# Patient Record
Sex: Female | Born: 1935
Health system: Southern US, Community
[De-identification: ages and names within clinical notes are randomized; demographics above are authoritative.]

## PROBLEM LIST (undated history)

## (undated) DIAGNOSIS — I4891 Unspecified atrial fibrillation: Secondary | ICD-10-CM

## (undated) DIAGNOSIS — M40209 Unspecified kyphosis, site unspecified: Secondary | ICD-10-CM

## (undated) DIAGNOSIS — R5383 Other fatigue: Secondary | ICD-10-CM

## (undated) DIAGNOSIS — I10 Essential (primary) hypertension: Secondary | ICD-10-CM

## (undated) DIAGNOSIS — C4491 Basal cell carcinoma of skin, unspecified: Secondary | ICD-10-CM

## (undated) HISTORY — DX: Other fatigue: R53.83

## (undated) HISTORY — DX: Unspecified atrial fibrillation: I48.91

## (undated) HISTORY — PX: BONE BIOPSY: SHX375

## (undated) HISTORY — PX: EYE SURGERY: SHX253

## (undated) HISTORY — DX: Unspecified kyphosis, site unspecified: M40.209

## (undated) HISTORY — PX: HIP FRACTURE SURGERY: SHX118

## (undated) HISTORY — DX: Basal cell carcinoma of skin, unspecified: C44.91

---

## 2000-03-13 ENCOUNTER — Encounter: Payer: Self-pay | Admitting: Neurosurgery

## 2000-03-14 ENCOUNTER — Encounter: Payer: Self-pay | Admitting: Neurosurgery

## 2000-03-14 ENCOUNTER — Inpatient Hospital Stay (HOSPITAL_COMMUNITY): Admission: RE | Admit: 2000-03-14 | Discharge: 2000-03-15 | Payer: Self-pay | Admitting: Neurosurgery

## 2001-01-25 ENCOUNTER — Other Ambulatory Visit: Admission: RE | Admit: 2001-01-25 | Discharge: 2001-01-25 | Payer: Self-pay | Admitting: Family Medicine

## 2006-01-25 ENCOUNTER — Encounter: Payer: Self-pay | Admitting: Interventional Radiology

## 2006-01-31 ENCOUNTER — Encounter (INDEPENDENT_AMBULATORY_CARE_PROVIDER_SITE_OTHER): Payer: Self-pay | Admitting: Specialist

## 2006-01-31 ENCOUNTER — Ambulatory Visit (HOSPITAL_COMMUNITY): Admission: RE | Admit: 2006-01-31 | Discharge: 2006-01-31 | Payer: Self-pay | Admitting: Interventional Radiology

## 2006-02-16 ENCOUNTER — Encounter: Payer: Self-pay | Admitting: Interventional Radiology

## 2006-04-26 ENCOUNTER — Encounter: Admission: RE | Admit: 2006-04-26 | Discharge: 2006-07-25 | Payer: Self-pay | Admitting: Family Medicine

## 2008-04-15 ENCOUNTER — Ambulatory Visit (HOSPITAL_COMMUNITY): Admission: RE | Admit: 2008-04-15 | Discharge: 2008-04-15 | Payer: Self-pay | Admitting: Family Medicine

## 2008-04-18 ENCOUNTER — Encounter (INDEPENDENT_AMBULATORY_CARE_PROVIDER_SITE_OTHER): Payer: Self-pay | Admitting: Interventional Radiology

## 2008-04-18 ENCOUNTER — Ambulatory Visit (HOSPITAL_COMMUNITY): Admission: RE | Admit: 2008-04-18 | Discharge: 2008-04-18 | Payer: Self-pay | Admitting: Interventional Radiology

## 2008-05-02 ENCOUNTER — Encounter: Payer: Self-pay | Admitting: Interventional Radiology

## 2008-05-28 ENCOUNTER — Inpatient Hospital Stay (HOSPITAL_COMMUNITY): Admission: EM | Admit: 2008-05-28 | Discharge: 2008-06-02 | Payer: Self-pay | Admitting: Emergency Medicine

## 2008-07-18 ENCOUNTER — Ambulatory Visit (HOSPITAL_COMMUNITY): Admission: RE | Admit: 2008-07-18 | Discharge: 2008-07-18 | Payer: Self-pay | Admitting: Interventional Radiology

## 2008-07-18 ENCOUNTER — Encounter: Payer: Self-pay | Admitting: Interventional Radiology

## 2008-07-23 ENCOUNTER — Ambulatory Visit (HOSPITAL_COMMUNITY): Admission: RE | Admit: 2008-07-23 | Discharge: 2008-07-23 | Payer: Self-pay | Admitting: Interventional Radiology

## 2008-07-23 ENCOUNTER — Encounter (INDEPENDENT_AMBULATORY_CARE_PROVIDER_SITE_OTHER): Payer: Self-pay | Admitting: Interventional Radiology

## 2009-04-07 ENCOUNTER — Ambulatory Visit (HOSPITAL_COMMUNITY): Admission: RE | Admit: 2009-04-07 | Discharge: 2009-04-07 | Payer: Self-pay | Admitting: Interventional Radiology

## 2009-04-13 ENCOUNTER — Encounter (INDEPENDENT_AMBULATORY_CARE_PROVIDER_SITE_OTHER): Payer: Self-pay | Admitting: Interventional Radiology

## 2009-04-13 ENCOUNTER — Ambulatory Visit (HOSPITAL_COMMUNITY): Admission: RE | Admit: 2009-04-13 | Discharge: 2009-04-13 | Payer: Self-pay | Admitting: Interventional Radiology

## 2009-08-02 ENCOUNTER — Ambulatory Visit: Payer: Self-pay | Admitting: Orthopedic Surgery

## 2009-08-02 ENCOUNTER — Inpatient Hospital Stay (HOSPITAL_COMMUNITY): Admission: EM | Admit: 2009-08-02 | Discharge: 2009-08-07 | Payer: Self-pay | Admitting: Emergency Medicine

## 2009-08-04 ENCOUNTER — Encounter: Payer: Self-pay | Admitting: Orthopedic Surgery

## 2009-08-07 ENCOUNTER — Encounter: Payer: Self-pay | Admitting: Orthopedic Surgery

## 2009-08-12 ENCOUNTER — Telehealth: Payer: Self-pay | Admitting: Orthopedic Surgery

## 2009-09-14 ENCOUNTER — Ambulatory Visit: Payer: Self-pay | Admitting: Orthopedic Surgery

## 2009-09-14 DIAGNOSIS — S72009A Fracture of unspecified part of neck of unspecified femur, initial encounter for closed fracture: Secondary | ICD-10-CM | POA: Insufficient documentation

## 2009-09-14 DIAGNOSIS — S42209A Unspecified fracture of upper end of unspecified humerus, initial encounter for closed fracture: Secondary | ICD-10-CM | POA: Insufficient documentation

## 2009-09-30 ENCOUNTER — Encounter: Payer: Self-pay | Admitting: Orthopedic Surgery

## 2009-10-05 ENCOUNTER — Encounter: Payer: Self-pay | Admitting: Orthopedic Surgery

## 2009-10-06 ENCOUNTER — Encounter (INDEPENDENT_AMBULATORY_CARE_PROVIDER_SITE_OTHER): Payer: Self-pay | Admitting: *Deleted

## 2009-10-06 ENCOUNTER — Encounter: Payer: Self-pay | Admitting: Orthopedic Surgery

## 2009-11-12 ENCOUNTER — Ambulatory Visit: Payer: Self-pay | Admitting: Orthopedic Surgery

## 2010-09-05 ENCOUNTER — Encounter: Payer: Self-pay | Admitting: Interventional Radiology

## 2010-09-05 ENCOUNTER — Encounter: Payer: Self-pay | Admitting: Family Medicine

## 2010-09-14 NOTE — Miscellaneous (Signed)
Summary: [facility] orders  [facility] orders   Imported By: Jacklynn Ganong 10/13/2009 13:07:19  _____________________________________________________________________  External Attachment:    Type:   Image     Comment:   External Document

## 2010-09-14 NOTE — Assessment & Plan Note (Signed)
Summary: 2 M RE-CK/XRAYS RT HIP/SURG 08/04/09/MEDICARE/CAF   Visit Type:  Follow-up  CC:  post op hip.  History of Present Illness: fractured RIGHT femoral neck, fractured RIGHT proximal humerus  Fracture proximal humerus is already healed and she did do some therapy for that.   DOS 08-04-09.  Procedure: OTIF of right hip with 3 cannulated screws.  week followup visit  Doing well, still has right shoulder pain, finished PT for hip and shoulder.  Tylenol and Vicodin for pain only as needed.  X-ray showed 3 cannulated screws in the RIGHT hip they're in good position fracture is healed impression healed RIGHT hip fracture with cannulated screws  The patient does have limited shoulder motion but is not a surgical candidate she is able to feed herself bathe self pressure teeth and do most household activities that she has been required to do is we will leave that alone for now she will continue home exercises  Followup p.r.n.      Other Orders: Post-Op Check (16109) Hip x-ray unilateral complete, minimum 2 views (60454)  Patient Instructions: 1)  Please schedule a follow-up appointment as needed.

## 2010-09-14 NOTE — Letter (Signed)
Summary: History form  History form   Imported By: Jacklynn Ganong 09/16/2009 08:55:56  _____________________________________________________________________  External Attachment:    Type:   Image     Comment:   External Document

## 2010-09-14 NOTE — Miscellaneous (Signed)
Summary: Nursing Home visit  [facility] Home visit   Imported By: Cammie Sickle 09/30/2009 09:55:17  _____________________________________________________________________  External Attachment:    Type:   Image     Comment:   External Document

## 2010-09-14 NOTE — Miscellaneous (Signed)
Summary: [facility] order  [facility] order   Imported By: Jacklynn Ganong 10/09/2009 08:08:21  _____________________________________________________________________  External Attachment:    Type:   Image     Comment:   External Document

## 2010-09-14 NOTE — Miscellaneous (Signed)
Summary: gave verbal order to extend OT 2 more weeks for hip and shoulder  Clinical Lists Changes  [facility]

## 2010-09-14 NOTE — Assessment & Plan Note (Signed)
Summary: 1 MO HOSP F/U RT FEM NECK FX/XR/SURGERY 08/04/09/MED/MEDICARE...   Visit Type:  Follow-up  CC:  post op right hip.  History of Present Illness: I saw [name] in the office today for a followup visit.  She is a 77 years old woman with the complaint of:  post op right hip.  DOS 08-04-09.  Procedure: OTIF of right hip with 3 cannulated screws.  Has non op right shoulder proximal humerus fracture   xrays 2 v right shoulder   Neer criteria met for prox humerus fracture   IMPR: non op prox hum fracture    2 views and pelvis right hip   3 cannulated screws with fracture position good   Assessment: Doing well; return in 2 mos fro xrays hip only     Impression & Recommendations:  Problem # 1:  AFTERCARE FOLLOW SURGERY MUSCULOSKEL SYSTEM NEC (ICD-V58.78) Assessment Comment Only  Orders: Post-Op Check (16109)  Problem # 2:  CLOSED FRACTURE UNSPECIFIED PART NECK FEMUR (ICD-820.8) Assessment: Comment Only  Orders: Post-Op Check (60454) Pelvis x-ray, 1/2 views (09811) Hip x-ray unilateral complete, minimum 2 views (91478)  Problem # 3:  FRACTURE, HUMERUS, PROXIMAL (ICD-812.00) Assessment: Comment Only  Orders: Post-Op Check (29562) Shoulder x-ray,  minimum 2 views (13086)  Patient Instructions: 1)  Please schedule a follow-up appointment in 2 months. 2)  xrays of the right hip [3 screws]

## 2010-09-27 ENCOUNTER — Other Ambulatory Visit (HOSPITAL_COMMUNITY): Payer: Self-pay | Admitting: Interventional Radiology

## 2010-09-27 DIAGNOSIS — M549 Dorsalgia, unspecified: Secondary | ICD-10-CM

## 2010-09-28 ENCOUNTER — Ambulatory Visit (HOSPITAL_COMMUNITY)
Admission: RE | Admit: 2010-09-28 | Discharge: 2010-09-28 | Disposition: A | Discharge status: Home / Self Care | Payer: MEDICARE | Source: Ambulatory Visit | Attending: Interventional Radiology | Admitting: Interventional Radiology

## 2010-09-28 DIAGNOSIS — M47817 Spondylosis without myelopathy or radiculopathy, lumbosacral region: Secondary | ICD-10-CM | POA: Insufficient documentation

## 2010-09-28 DIAGNOSIS — M549 Dorsalgia, unspecified: Secondary | ICD-10-CM | POA: Insufficient documentation

## 2010-09-28 DIAGNOSIS — Z981 Arthrodesis status: Secondary | ICD-10-CM | POA: Insufficient documentation

## 2010-09-28 DIAGNOSIS — M8448XA Pathological fracture, other site, initial encounter for fracture: Secondary | ICD-10-CM | POA: Insufficient documentation

## 2010-09-28 DIAGNOSIS — M519 Unspecified thoracic, thoracolumbar and lumbosacral intervertebral disc disorder: Secondary | ICD-10-CM | POA: Insufficient documentation

## 2010-09-28 DIAGNOSIS — M48061 Spinal stenosis, lumbar region without neurogenic claudication: Secondary | ICD-10-CM | POA: Insufficient documentation

## 2010-09-29 ENCOUNTER — Other Ambulatory Visit (HOSPITAL_COMMUNITY): Payer: Self-pay | Admitting: Interventional Radiology

## 2010-09-29 DIAGNOSIS — IMO0002 Reserved for concepts with insufficient information to code with codable children: Secondary | ICD-10-CM

## 2010-10-01 ENCOUNTER — Ambulatory Visit (HOSPITAL_COMMUNITY)
Admission: RE | Admit: 2010-10-01 | Discharge: 2010-10-01 | Disposition: A | Discharge status: Home / Self Care | Payer: MEDICARE | Source: Ambulatory Visit | Attending: Interventional Radiology | Admitting: Interventional Radiology

## 2010-10-01 ENCOUNTER — Other Ambulatory Visit: Payer: Self-pay | Admitting: Interventional Radiology

## 2010-10-01 DIAGNOSIS — I10 Essential (primary) hypertension: Secondary | ICD-10-CM | POA: Insufficient documentation

## 2010-10-01 DIAGNOSIS — E119 Type 2 diabetes mellitus without complications: Secondary | ICD-10-CM | POA: Insufficient documentation

## 2010-10-01 DIAGNOSIS — Z7901 Long term (current) use of anticoagulants: Secondary | ICD-10-CM | POA: Insufficient documentation

## 2010-10-01 DIAGNOSIS — IMO0002 Reserved for concepts with insufficient information to code with codable children: Secondary | ICD-10-CM

## 2010-10-01 DIAGNOSIS — I4891 Unspecified atrial fibrillation: Secondary | ICD-10-CM | POA: Insufficient documentation

## 2010-10-01 DIAGNOSIS — M8448XA Pathological fracture, other site, initial encounter for fracture: Secondary | ICD-10-CM | POA: Insufficient documentation

## 2010-10-01 LAB — CBC
Hemoglobin: 11.9 g/dL — ABNORMAL LOW (ref 12.0–15.0)
MCH: 29.5 pg (ref 26.0–34.0)
MCV: 85.4 fL (ref 78.0–100.0)
Platelets: 243 10*3/uL (ref 150–400)
RBC: 4.04 MIL/uL (ref 3.87–5.11)
RDW: 13.1 % (ref 11.5–15.5)

## 2010-10-01 LAB — POCT I-STAT, CHEM 8
BUN: 24 mg/dL — ABNORMAL HIGH (ref 6–23)
Calcium, Ion: 1.1 mmol/L — ABNORMAL LOW (ref 1.12–1.32)
Creatinine, Ser: 1 mg/dL (ref 0.4–1.2)
TCO2: 24 mmol/L (ref 0–100)

## 2010-10-01 LAB — PROTIME-INR: Prothrombin Time: 16.3 seconds — ABNORMAL HIGH (ref 11.6–15.2)

## 2010-10-06 ENCOUNTER — Other Ambulatory Visit (HOSPITAL_COMMUNITY): Payer: Self-pay | Admitting: Interventional Radiology

## 2010-10-06 ENCOUNTER — Ambulatory Visit (HOSPITAL_COMMUNITY)
Admission: RE | Admit: 2010-10-06 | Discharge: 2010-10-06 | Disposition: A | Discharge status: Home / Self Care | Payer: MEDICARE | Source: Ambulatory Visit | Attending: Interventional Radiology | Admitting: Interventional Radiology

## 2010-10-06 DIAGNOSIS — M545 Low back pain, unspecified: Secondary | ICD-10-CM | POA: Insufficient documentation

## 2010-10-06 DIAGNOSIS — M549 Dorsalgia, unspecified: Secondary | ICD-10-CM

## 2010-10-07 ENCOUNTER — Other Ambulatory Visit (HOSPITAL_COMMUNITY): Payer: Self-pay | Admitting: Interventional Radiology

## 2010-10-07 DIAGNOSIS — IMO0002 Reserved for concepts with insufficient information to code with codable children: Secondary | ICD-10-CM

## 2010-10-08 ENCOUNTER — Ambulatory Visit (HOSPITAL_COMMUNITY)
Admission: RE | Admit: 2010-10-08 | Discharge: 2010-10-08 | Disposition: A | Discharge status: Home / Self Care | Payer: Medicare Other | Source: Ambulatory Visit | Attending: Interventional Radiology | Admitting: Interventional Radiology

## 2010-10-08 ENCOUNTER — Other Ambulatory Visit (HOSPITAL_COMMUNITY): Payer: Self-pay | Admitting: Interventional Radiology

## 2010-10-08 DIAGNOSIS — IMO0002 Reserved for concepts with insufficient information to code with codable children: Secondary | ICD-10-CM

## 2010-10-08 DIAGNOSIS — M546 Pain in thoracic spine: Secondary | ICD-10-CM | POA: Insufficient documentation

## 2010-10-08 DIAGNOSIS — M8448XA Pathological fracture, other site, initial encounter for fracture: Secondary | ICD-10-CM | POA: Insufficient documentation

## 2010-10-08 LAB — CBC
HCT: 33.3 % — ABNORMAL LOW (ref 36.0–46.0)
Hemoglobin: 11.3 g/dL — ABNORMAL LOW (ref 12.0–15.0)
MCV: 85.2 fL (ref 78.0–100.0)
RBC: 3.91 MIL/uL (ref 3.87–5.11)
WBC: 8 10*3/uL (ref 4.0–10.5)

## 2010-10-08 LAB — POCT I-STAT, CHEM 8
BUN: 20 mg/dL (ref 6–23)
Chloride: 106 mEq/L (ref 96–112)
Creatinine, Ser: 1.1 mg/dL (ref 0.4–1.2)
Potassium: 4.2 mEq/L (ref 3.5–5.1)
Sodium: 140 mEq/L (ref 135–145)

## 2010-10-08 MED ORDER — IOHEXOL 300 MG/ML  SOLN: 3.0000 mL | Freq: Once | INTRAMUSCULAR | Status: AC | PRN

## 2010-10-08 MED ORDER — IOHEXOL 300 MG/ML  SOLN: 50.0000 mL | Freq: Once | INTRAMUSCULAR | Status: DC | PRN

## 2010-10-11 ENCOUNTER — Other Ambulatory Visit (HOSPITAL_COMMUNITY): Payer: Self-pay | Admitting: Interventional Radiology

## 2010-10-11 DIAGNOSIS — IMO0002 Reserved for concepts with insufficient information to code with codable children: Secondary | ICD-10-CM

## 2010-10-22 ENCOUNTER — Ambulatory Visit (HOSPITAL_COMMUNITY)
Admission: RE | Admit: 2010-10-22 | Discharge: 2010-10-22 | Disposition: A | Discharge status: Home / Self Care | Payer: Medicare Other | Source: Ambulatory Visit | Attending: Interventional Radiology | Admitting: Interventional Radiology

## 2010-10-22 ENCOUNTER — Ambulatory Visit (HOSPITAL_COMMUNITY): Admission: RE | Admit: 2010-10-22 | Payer: MEDICARE | Source: Ambulatory Visit

## 2010-10-22 ENCOUNTER — Other Ambulatory Visit (HOSPITAL_COMMUNITY): Payer: Self-pay | Admitting: Interventional Radiology

## 2010-10-22 DIAGNOSIS — M549 Dorsalgia, unspecified: Secondary | ICD-10-CM

## 2010-10-22 DIAGNOSIS — IMO0002 Reserved for concepts with insufficient information to code with codable children: Secondary | ICD-10-CM

## 2010-10-25 ENCOUNTER — Encounter (HOSPITAL_COMMUNITY): Payer: Medicare Other

## 2010-10-25 ENCOUNTER — Encounter (HOSPITAL_COMMUNITY): Payer: Self-pay

## 2010-10-25 ENCOUNTER — Ambulatory Visit (HOSPITAL_COMMUNITY)
Admission: RE | Admit: 2010-10-25 | Discharge: 2010-10-25 | Disposition: A | Discharge status: Home / Self Care | Payer: Medicare Other | Source: Ambulatory Visit | Attending: Diagnostic Radiology | Admitting: Diagnostic Radiology

## 2010-10-25 ENCOUNTER — Other Ambulatory Visit (HOSPITAL_COMMUNITY): Payer: Self-pay | Admitting: Diagnostic Radiology

## 2010-10-25 ENCOUNTER — Encounter (HOSPITAL_COMMUNITY)
Admission: RE | Admit: 2010-10-25 | Discharge: 2010-10-25 | Disposition: A | Discharge status: Home / Self Care | Payer: Medicare Other | Source: Ambulatory Visit | Attending: Interventional Radiology | Admitting: Interventional Radiology

## 2010-10-25 DIAGNOSIS — IMO0002 Reserved for concepts with insufficient information to code with codable children: Secondary | ICD-10-CM

## 2010-10-25 DIAGNOSIS — M549 Dorsalgia, unspecified: Secondary | ICD-10-CM | POA: Insufficient documentation

## 2010-10-25 DIAGNOSIS — M545 Low back pain, unspecified: Secondary | ICD-10-CM | POA: Insufficient documentation

## 2010-10-25 DIAGNOSIS — Z9889 Other specified postprocedural states: Secondary | ICD-10-CM | POA: Insufficient documentation

## 2010-10-25 HISTORY — DX: Essential (primary) hypertension: I10

## 2010-10-25 MED ORDER — TECHNETIUM TC 99M MEDRONATE IV KIT
25.0000 | PACK | Freq: Once | INTRAVENOUS | Status: AC | PRN
  Administered 2010-10-25: 25.3 via INTRAVENOUS

## 2010-10-27 ENCOUNTER — Inpatient Hospital Stay (HOSPITAL_COMMUNITY)
Admission: EM | Admit: 2010-10-27 | Discharge: 2010-10-28 | DRG: 535 | Disposition: A | Discharge status: Home Health | Payer: Medicare Other | Attending: Internal Medicine | Admitting: Internal Medicine

## 2010-10-27 ENCOUNTER — Emergency Department (HOSPITAL_COMMUNITY): Payer: Medicare Other

## 2010-10-27 DIAGNOSIS — E119 Type 2 diabetes mellitus without complications: Secondary | ICD-10-CM | POA: Diagnosis present

## 2010-10-27 DIAGNOSIS — R112 Nausea with vomiting, unspecified: Secondary | ICD-10-CM | POA: Diagnosis present

## 2010-10-27 DIAGNOSIS — S32509A Unspecified fracture of unspecified pubis, initial encounter for closed fracture: Principal | ICD-10-CM | POA: Diagnosis present

## 2010-10-27 DIAGNOSIS — I4891 Unspecified atrial fibrillation: Secondary | ICD-10-CM | POA: Diagnosis present

## 2010-10-27 DIAGNOSIS — J9600 Acute respiratory failure, unspecified whether with hypoxia or hypercapnia: Secondary | ICD-10-CM | POA: Diagnosis present

## 2010-10-27 DIAGNOSIS — I6200 Nontraumatic subdural hemorrhage, unspecified: Secondary | ICD-10-CM | POA: Diagnosis present

## 2010-10-27 DIAGNOSIS — M810 Age-related osteoporosis without current pathological fracture: Secondary | ICD-10-CM | POA: Diagnosis present

## 2010-10-27 DIAGNOSIS — W010XXA Fall on same level from slipping, tripping and stumbling without subsequent striking against object, initial encounter: Secondary | ICD-10-CM | POA: Diagnosis present

## 2010-10-27 DIAGNOSIS — T4275XA Adverse effect of unspecified antiepileptic and sedative-hypnotic drugs, initial encounter: Secondary | ICD-10-CM | POA: Diagnosis present

## 2010-10-27 DIAGNOSIS — I10 Essential (primary) hypertension: Secondary | ICD-10-CM | POA: Diagnosis present

## 2010-10-27 DIAGNOSIS — Y92009 Unspecified place in unspecified non-institutional (private) residence as the place of occurrence of the external cause: Secondary | ICD-10-CM

## 2010-10-27 DIAGNOSIS — Z9181 History of falling: Secondary | ICD-10-CM

## 2010-10-27 DIAGNOSIS — Z7901 Long term (current) use of anticoagulants: Secondary | ICD-10-CM

## 2010-10-27 DIAGNOSIS — R627 Adult failure to thrive: Secondary | ICD-10-CM | POA: Diagnosis present

## 2010-10-27 LAB — DIFFERENTIAL
Basophils Absolute: 0 K/uL (ref 0.0–0.1)
Basophils Relative: 0 % (ref 0–1)
Eosinophils Absolute: 0.2 K/uL (ref 0.0–0.7)
Eosinophils Relative: 1 % (ref 0–5)
Lymphocytes Relative: 7 % — ABNORMAL LOW (ref 12–46)
Lymphs Abs: 0.9 10*3/uL (ref 0.7–4.0)
Monocytes Absolute: 0.5 K/uL (ref 0.1–1.0)
Monocytes Relative: 3 % (ref 3–12)
Neutro Abs: 11.6 K/uL — ABNORMAL HIGH (ref 1.7–7.7)
Neutrophils Relative %: 88 % — ABNORMAL HIGH (ref 43–77)

## 2010-10-27 LAB — BLOOD GAS, ARTERIAL
Acid-base deficit: 0.1 mmol/L (ref 0.0–2.0)
Bicarbonate: 25 mEq/L — ABNORMAL HIGH (ref 20.0–24.0)
Drawn by: 103701
FIO2: 0.21 %
O2 Saturation: 80.2 %
Patient temperature: 98.6
TCO2: 23 mmol/L (ref 0–100)
pCO2 arterial: 45.1 mmHg — ABNORMAL HIGH (ref 35.0–45.0)
pH, Arterial: 7.363 (ref 7.350–7.400)
pO2, Arterial: 47 mmHg — ABNORMAL LOW (ref 80.0–100.0)

## 2010-10-27 LAB — CK TOTAL AND CKMB (NOT AT ARMC)
CK, MB: 1.4 ng/mL (ref 0.3–4.0)
Relative Index: INVALID (ref 0.0–2.5)
Total CK: 30 U/L (ref 7–177)

## 2010-10-27 LAB — CBC
HCT: 36.7 % (ref 36.0–46.0)
Hemoglobin: 12.2 g/dL (ref 12.0–15.0)
MCH: 28.8 pg (ref 26.0–34.0)
MCHC: 33.2 g/dL (ref 30.0–36.0)
MCV: 86.8 fL (ref 78.0–100.0)
Platelets: 186 K/uL (ref 150–400)
RBC: 4.23 MIL/uL (ref 3.87–5.11)
RDW: 13.2 % (ref 11.5–15.5)
WBC: 13.1 10*3/uL — ABNORMAL HIGH (ref 4.0–10.5)

## 2010-10-27 LAB — BASIC METABOLIC PANEL
CO2: 26 mEq/L (ref 19–32)
Chloride: 107 mEq/L (ref 96–112)
GFR calc non Af Amer: 57 mL/min — ABNORMAL LOW (ref >60)
Glucose, Bld: 207 mg/dL — ABNORMAL HIGH (ref 70–99)
Potassium: 3.8 mEq/L (ref 3.5–5.1)
Sodium: 140 mEq/L (ref 135–145)

## 2010-10-27 LAB — GLUCOSE, CAPILLARY: Glucose-Capillary: 190 mg/dL — ABNORMAL HIGH (ref 70–99)

## 2010-10-27 LAB — BASIC METABOLIC PANEL WITH GFR
BUN: 17 mg/dL (ref 6–23)
Calcium: 9.4 mg/dL (ref 8.4–10.5)
Creatinine, Ser: 0.92 mg/dL (ref 0.4–1.2)
GFR calc Af Amer: >60 mL/min (ref >60)

## 2010-10-27 LAB — TROPONIN I: Troponin I: 0.02 ng/mL (ref 0.00–0.06)

## 2010-10-27 LAB — PROTIME-INR
INR: 3.64 — ABNORMAL HIGH (ref 0.00–1.49)
Prothrombin Time: 36.2 s — ABNORMAL HIGH (ref 11.6–15.2)

## 2010-10-28 LAB — URINE MICROSCOPIC-ADD ON

## 2010-10-28 LAB — URINALYSIS, ROUTINE W REFLEX MICROSCOPIC
Leukocytes, UA: NEGATIVE
Nitrite: NEGATIVE
Specific Gravity, Urine: 1.015 (ref 1.005–1.030)
Urobilinogen, UA: 1 mg/dL (ref 0.0–1.0)
pH: 5.5 (ref 5.0–8.0)

## 2010-10-29 LAB — URINE CULTURE

## 2010-10-29 NOTE — H&P (Signed)
[name], TIPPETTS NO.:  [id number]  MEDICAL RECORD NO.:  [id number]           PATIENT TYPE:  E  LOCATION:  WLED                         FACILITY:  [facility]  PHYSICIAN:  Vania Rea, M.D. DATE OF BIRTH:  [birth year]  DATE OF ADMISSION:  10/27/2010 DATE OF DISCHARGE:                             HISTORY & PHYSICAL   PRIMARY CARE PHYSICIAN:  Rudi Heap, M.D.  ORTHOPEDIC SURGEONS:  Dr. Dorene Grebe in [place] and Dr. Fuller Canada in [place].  CHIEF COMPLAINT:  Recurrent falls.  HISTORY OF PRESENT ILLNESS:  This is a 77 year old Caucasian lady with a history of osteoporosis and multiple vertebral fractures status post multiple kyphoplasties, last kyphoplasty 2 weeks ago, who also has a history of falls occasioning fractures of both hips over the year.  She is status post bilateral hip repair.  She also has a history of status post fall with a right humeral fracture which was not amenable tosurgery.  In any event, because of the patient's ongoing gait instability, her children have been living at home with her in order to assist her, but she got up to go to the bathroom at 12:30 a.m. last night and sustained a fall.  She did not hit her head, was found unconscious on the floor complaining of pain in her left ribs, and was brought into the emergency room this morning for further evaluation. During the course of the evaluation, the patient was found to have a superior pubic ramus fracture and the Hospitalist Service was called to assist with management.  The patient denies any chest pain, shortness of breath, or palpitations.  She denies any passage of bloody or black stool.  There is no history of fever.  The patient is on chronic Coumadin anticoagulation for atrial fibrillation despite the fact that she apparently does not want to be on Coumadin, and her family does not want her to be on Coumadin and they are worried because of her frequent falls  whilst on Coumadin.  In the ambulance en route, the patient received 2 mg of Dilaudid and subsequently started having episodes of nausea and vomiting.  In the emergency room, the patient has received intravenous fentanyl for pain as well as Zofran and Phenergan for nausea.  Consequently, the patient is somewhat groggy and is also somewhat hypoxic, presumably due to respiratory suppression from narcotics.  However, her family is at bedside and able to give a clear history.  PAST MEDICAL HISTORY: 1. Hypertension. 2. Diabetes. 3. Chronic atrial fibrillation, on long-term anticoagulation. 4. Multiple vertebral compression fractures status post multiple     episodes of kyphoplasty. 5. Osteoporosis, intolerant of calcium. 6. Bilateral hip fractures status post bilateral hip repairs. 7. Remote right humerus fracture status post reduction. 8. History of depression.  MEDICATIONS: 1. Diltiazem 240 mg daily. 2. Metformin 500 mg daily. 3. Sertraline 100 mg daily. 4. Coumadin per protocol.  ALLERGIES:  CODEINE.  SOCIAL HISTORY:  No history of tobacco, alcohol, or illicit drug use. She is a retired Tree surgeon.  She spends her days just basically ambulating around her home.  She uses a walker for ambulation.  Her code status is do not resuscitate.  FAMILY HISTORY:  Significant for cancers, coronary artery disease, and diabetes.  She has four living siblings; one brother, four sisters. Ages range 37-95.  There is no family history of strokes.  REVIEW OF SYSTEMS:  Other than noted above, unremarkable.  PHYSICAL EXAM:  GENERAL:  Frail, elderly Caucasian lady reclining in the stretcher.  Somewhat sedated. VITALS SIGNS:  Her temperature is 97.9, her pulse is 90, respirations 20, blood pressure 147/72.  She is saturating at 88% on room air. HEENT:  Her pupils are round and equal.  Mucous membranes are pale. Anicteric.  No cervical lymphadenopathy.  No thyromegaly.  No carotid bruit. CHEST:   Clear to auscultation bilaterally. CARDIOVASCULAR SYSTEM:  Irregularly irregular rhythm. ABDOMEN:  Scaphoid and soft.  She is tender in the left anterior flank over the lower ribs.  There are no masses, no bruising of the skin. EXTREMITIES:  Wasted.  There is no edema.  She has arthritic deformities of the hands, knees, ankles.  Feet are cool.  The capillary refill is brisk.  Dorsalis pedis pulses are 1+ bilaterally. CENTRAL NERVOUS SYSTEM:  Within the limits of the exam.  Cranial nerves 2-12 are grossly intact.  She has no focal lateralizing signs.  LABS:  Arterial blood gas on room air shows a pH of 7.36, pCO2 45, and pO2 47, saturating at 80%.  Her cardiac enzymes are unremarkable with a total CK of 30, CK-MB 1.4, and troponin 0.02.  Her white count is 13.1, hemoglobin 12.2, platelets 186.  She has 88% neutrophils.  Absolute neutrophil count is 11.16.  Her sodium is 140, potassium 3.8, chloride 107, CO2 26, glucose 207, BUN 17, creatinine 0.9, calcium 9.4.  Her INR is supratherapeutic at 3.64.  X-RAYS:  A CT of her head shows a low density subdural hygroma on the left which may be chronic, 4-mm midline shift towards the right.  No acute hemorrhage, atrophy, and chronic microvascular ischemia.  CT of the pelvis without contrast shows nondisplaced left superior pubic symphysis fracture, callus formation with probable remote trauma of the left superior pubic ramus, and nondisplaced acute fracture may be present as well.  Postoperative changes of the hips bilaterally. Heterotopic bone about the left greater trochanter.  Rib views show no definite acute findings, but there do appear to be several old left rib fractures.  X-rays of the spine show no new fracture or progressive collapse.  She does have a subacute T12 treated with kyphoplasty.  X- rays of the hips show postoperative changes.  EKG shows atrial fibrillation with nonspecific ST-wave changes.  Rate is 89.  ASSESSMENT: 1.  Right superior pubic ramus fracture. 2. Recurrent falls. 3. Adult failure to thrive. 4. Chronic atrial fibrillation, on long-term Coumadin anticoagulation. 5. Diabetes type 2, uncontrolled. 6. Hypertension. 7. History of depression. 8. Acute respiratory failure, likely secondary to narcotic therapy. 9. Nausea and vomiting, likely secondary to narcotic therapy.  PLAN: 1. Will admit this lady for physical therapy and pain control.  She     will likely be discharged back home for home physical therapy and     24-hour care as indicated by her family. 2. Because of her recurrent falls, would recommend discontinuing     Coumadin and starting her on baby aspirin.  She is a high fall risk     and she does have past evidence of old subdural hematomas. 3. Will check urinalysis to rule out urinary tract infection  contributing to her instability. 4. Will continue her metformin, but will add sliding scale insulin to     attempt to improve control. 5. Will continue her other home medications. 6. Other plans as per orders.     Vania Rea, M.D.     LC/MEDQ  D:  10/27/2010  T:  10/27/2010  Job:  [id number]  cc:   Ernestina Penna, M.D. Fax: [phone]  Vickki Hearing, M.D. Fax: [phone]  Dr. Corliss Skains Interventional Radiology  Dr. Lance Bosch, [place]  Electronically Signed by Vania Rea M.D. on 10/29/2010 02:18:32 AM

## 2010-11-01 LAB — GLUCOSE, CAPILLARY: Glucose-Capillary: 156 mg/dL — ABNORMAL HIGH (ref 70–99)

## 2010-11-09 NOTE — Discharge Summary (Signed)
[name], [name]               ACCOUNT NO.:  [id number]  MEDICAL RECORD NO.:  [id number]           PATIENT TYPE:  I  LOCATION:  1506                         FACILITY:  [facility]  PHYSICIAN:  Margaret Blinks, MD     DATE OF BIRTH:  12-28-1917  DATE OF ADMISSION:  10/27/2010 DATE OF DISCHARGE:                              DISCHARGE SUMMARY   PRIMARY CARE PHYSICIAN:  [name], M.D.  ORTHOPEDIST:  [name], M.D.  DISCHARGE DIAGNOSES: 1. Left superior pubic symphysis fracture status post fall. 2. Left superior pubic ramus fracture status post fall. 3. History of multiple falls. 4. Atrial fibrillation, previously on Coumadin, now discontinued     secondary to multiple falls. 5. Type 2 diabetes. 6. History of depression. 7. Hypertension. 8. Acute respiratory failure likely secondary to narcotic therapy,     resolved. 9. Nausea and vomiting, resolved.  DISCHARGE MEDICATIONS: 1. Aspirin 81 mg p.o. daily. 2. Zoloft 100 mg p.o. daily. 3. Metformin XR 500 mg p.o. daily. 4. Cardizem CD 240 mg p.o. daily. 5. Hydrocodone/APAP 5/500 mg 1 tablet p.o. daily p.r.n.  MEDICATIONS STOPPED IN THE HOSPITAL: 1. Coumadin. 2. Ambien.  ADMISSION HISTORY:  This is a 77 year old Caucasian lady with a history of osteoporosis, multiple vertebral fractures, status post multiple kyphoplasties, who has had multiple falls and in fact has had bilateral hip repair in the past.  She presents to the hospital after having a fall at 12:30 a.m.  The patient was complaining of pain in her left ribs and brought to the emergency room.  During her evaluation, she was noted to have a superior pubic ramus fracture, and the hospitalists were asked to assist in management.  For further details, please refer to the history and physical dictated by Dr. Orvan [name] on October 27, 2010.  HOSPITAL COURSE: 1. Pelvic fractures.  Case was discussed with Dr. Ophelia [name] from [facility] and it was recommended  that the patient be     weightbearing as tolerated.  Continue with physical therapy as well     as pain management.  He recommended to follow up with Dr. August [name] in     the next 1 week.  The patient is being seen by Physical Therapy at     this time and is cleared for discharge home.  We will set her up     with home health physical therapy, otherwise, we will discuss     possible skilled nursing facility options with her. 2. Chronic atrial fibrillation.  The patient is currently rate     controlled.  She is clearly not a candidate for Coumadin with her     multiple falls and high risk for bleeding. 3. Abnormal CT head.  The patient was found to have a low-density     subdural hygroma on the left which appeared to be chronic, 2-4 mm     midline shift towards the right with no acute hemorrhage.  The     patient does not have any neurologic deficits at this time.  She is     likely not a candidate  for any neurosurgical intervention.  We will     have the patient follow up with the primary care physician and     possible referral to outpatient neurosurgery if felt appropriate or     if the patient does develop any neurologic symptoms.  CONSULTATIONS:  Telephone consultation with Dr. Ophelia [name] from [facility] with results as above.  PROCEDURES:  None.  DIAGNOSTIC IMAGING: 1. X-ray of the left hip on October 27, 2010, shows postoperative     changes and osteopenia, no acute findings. 2. X-ray of lumbar spine on October 27, 2010, shows no acute fracture,     progressive collapse identified compared to prior exams.  The     patient has known subacute fracture at T12, treated with     kyphoplasty. 3. X-ray of the ribs on October 27, 2010, shows no definite acute     findings.  There appeared to be several old left rib fractures. 4. CT of the pelvis on October 27, 2010, shows nondisplaced left     superior pubic symphysis fracture, callus formation, with probable     remote trauma of the left  superior pubic ramus, nondisplaced acute     fracture may be present as well.  Postoperative changes with     bilaterally heterotopic bone about the left greater trochanter. 5. CT head on October 27, 2010, shows low density subdural hygroma on     the left which may be chronic, 4 mm midline shift towards the     right, no acute hemorrhage, atrophy, or microvascular ischemia. 6. Chest x-ray from October 27, 2010, shows no acute abnormalities and     osteopenia.  DISCHARGE INSTRUCTIONS:  The patient should continue on heart-healthy, low-calorie diet.  Conduct her activity as tolerated.  If she is cleared by Physical Therapy, she will be set up with home health physical therapy if she will be continuing to stay with her family at home.  She tells me that she does have someone with her 24 hours.  CONDITION AT TIME OF DISCHARGE:  Improved.  Plan was discussed with the patient as well as her family who are in agreement.  TIME SPENT:  50 minutes.     Margaret Blinks, MD     JM/MEDQ  D:  10/28/2010  T:  10/28/2010  Job:  [id number]  cc:   [name], M.D. Fax: [phone]  G. Dorene Grebe, M.D. Fax: [phone]  Electronically Signed by [name]  on 11/09/2010 06:15:41 PM

## 2010-11-15 LAB — GLUCOSE, CAPILLARY
Glucose-Capillary: 150 mg/dL — ABNORMAL HIGH (ref 70–99)
Glucose-Capillary: 151 mg/dL — ABNORMAL HIGH (ref 70–99)
Glucose-Capillary: 154 mg/dL — ABNORMAL HIGH (ref 70–99)
Glucose-Capillary: 166 mg/dL — ABNORMAL HIGH (ref 70–99)
Glucose-Capillary: 174 mg/dL — ABNORMAL HIGH (ref 70–99)
Glucose-Capillary: 181 mg/dL — ABNORMAL HIGH (ref 70–99)
Glucose-Capillary: 238 mg/dL — ABNORMAL HIGH (ref 70–99)
Glucose-Capillary: 95 mg/dL (ref 70–99)

## 2010-11-15 LAB — CBC
HCT: 25.7 % — ABNORMAL LOW (ref 36.0–46.0)
HCT: 30.1 % — ABNORMAL LOW (ref 36.0–46.0)
Hemoglobin: 12 g/dL (ref 12.0–15.0)
Hemoglobin: 8.8 g/dL — ABNORMAL LOW (ref 12.0–15.0)
MCHC: 33 g/dL (ref 30.0–36.0)
MCHC: 33.2 g/dL (ref 30.0–36.0)
MCHC: 33.5 g/dL (ref 30.0–36.0)
MCHC: 34.1 g/dL (ref 30.0–36.0)
MCHC: 34.2 g/dL (ref 30.0–36.0)
MCHC: 34.2 g/dL (ref 30.0–36.0)
MCV: 87.3 fL (ref 78.0–100.0)
MCV: 87.7 fL (ref 78.0–100.0)
MCV: 88.2 fL (ref 78.0–100.0)
MCV: 88.5 fL (ref 78.0–100.0)
MCV: 88.7 fL (ref 78.0–100.0)
Platelets: 175 10*3/uL (ref 150–400)
RBC: 3.41 MIL/uL — ABNORMAL LOW (ref 3.87–5.11)
RBC: 3.73 MIL/uL — ABNORMAL LOW (ref 3.87–5.11)
RBC: 4.03 MIL/uL (ref 3.87–5.11)
RDW: 13.1 % (ref 11.5–15.5)
RDW: 13.2 % (ref 11.5–15.5)
RDW: 13.2 % (ref 11.5–15.5)
RDW: 13.3 % (ref 11.5–15.5)
WBC: 11 10*3/uL — ABNORMAL HIGH (ref 4.0–10.5)
WBC: 8.6 10*3/uL (ref 4.0–10.5)

## 2010-11-15 LAB — COMPREHENSIVE METABOLIC PANEL
ALT: 11 U/L (ref 0–35)
ALT: 20 U/L (ref 0–35)
AST: 21 U/L (ref 0–37)
AST: 41 U/L — ABNORMAL HIGH (ref 0–37)
Albumin: 2.5 g/dL — ABNORMAL LOW (ref 3.5–5.2)
Alkaline Phosphatase: 79 U/L (ref 39–117)
CO2: 24 mEq/L (ref 19–32)
CO2: 25 mEq/L (ref 19–32)
Calcium: 8.7 mg/dL (ref 8.4–10.5)
Calcium: 8.9 mg/dL (ref 8.4–10.5)
Chloride: 101 mEq/L (ref 96–112)
GFR calc Af Amer: >60 mL/min (ref >60)
GFR calc Af Amer: >60 mL/min (ref >60)
GFR calc non Af Amer: 57 mL/min — ABNORMAL LOW (ref >60)
GFR calc non Af Amer: >60 mL/min (ref >60)
Glucose, Bld: 139 mg/dL — ABNORMAL HIGH (ref 70–99)
Potassium: 4.1 mEq/L (ref 3.5–5.1)
Sodium: 133 mEq/L — ABNORMAL LOW (ref 135–145)
Sodium: 138 mEq/L (ref 135–145)
Total Bilirubin: 0.5 mg/dL (ref 0.3–1.2)
Total Protein: 5.5 g/dL — ABNORMAL LOW (ref 6.0–8.3)

## 2010-11-15 LAB — CROSSMATCH
ABO/RH(D): O POS
Antibody Screen: NEGATIVE

## 2010-11-15 LAB — DIFFERENTIAL
Basophils Absolute: 0 10*3/uL (ref 0.0–0.1)
Basophils Absolute: 0 10*3/uL (ref 0.0–0.1)
Basophils Relative: 0 % (ref 0–1)
Basophils Relative: 0 % (ref 0–1)
Basophils Relative: 0 % (ref 0–1)
Eosinophils Absolute: 0 10*3/uL (ref 0.0–0.7)
Eosinophils Absolute: 0 10*3/uL (ref 0.0–0.7)
Eosinophils Absolute: 0 10*3/uL (ref 0.0–0.7)
Eosinophils Absolute: 0 10*3/uL (ref 0.0–0.7)
Eosinophils Absolute: 0.1 10*3/uL (ref 0.0–0.7)
Eosinophils Relative: 0 % (ref 0–5)
Eosinophils Relative: 0 % (ref 0–5)
Eosinophils Relative: 0 % (ref 0–5)
Lymphocytes Relative: 10 % — ABNORMAL LOW (ref 12–46)
Lymphs Abs: 0.7 10*3/uL (ref 0.7–4.0)
Lymphs Abs: 1 10*3/uL (ref 0.7–4.0)
Monocytes Absolute: 0.4 10*3/uL (ref 0.1–1.0)
Monocytes Absolute: 0.5 10*3/uL (ref 0.1–1.0)
Monocytes Absolute: 0.5 10*3/uL (ref 0.1–1.0)
Monocytes Absolute: 0.5 10*3/uL (ref 0.1–1.0)
Monocytes Relative: 5 % (ref 3–12)
Monocytes Relative: 6 % (ref 3–12)
Neutro Abs: 6.1 10*3/uL (ref 1.7–7.7)
Neutrophils Relative %: 82 % — ABNORMAL HIGH (ref 43–77)
Neutrophils Relative %: 86 % — ABNORMAL HIGH (ref 43–77)

## 2010-11-15 LAB — ABO/RH: ABO/RH(D): O POS

## 2010-11-15 LAB — HEMOGLOBIN AND HEMATOCRIT, BLOOD: Hemoglobin: 10.8 g/dL — ABNORMAL LOW (ref 12.0–15.0)

## 2010-11-15 LAB — POCT I-STAT 4, (NA,K, GLUC, HGB,HCT)
HCT: 25 % — ABNORMAL LOW (ref 36.0–46.0)
Hemoglobin: 8.5 g/dL — ABNORMAL LOW (ref 12.0–15.0)
Potassium: 4.3 mEq/L (ref 3.5–5.1)
Sodium: 139 mEq/L (ref 135–145)

## 2010-11-15 LAB — PROTIME-INR
INR: 2.44 — ABNORMAL HIGH (ref 0.00–1.49)
Prothrombin Time: 15.9 seconds — ABNORMAL HIGH (ref 11.6–15.2)

## 2010-11-15 LAB — HEMOGLOBIN A1C: Hgb A1c MFr Bld: 6.1 % (ref 4.6–6.1)

## 2010-11-15 LAB — BASIC METABOLIC PANEL
BUN: 12 mg/dL (ref 6–23)
CO2: 23 mEq/L (ref 19–32)
CO2: 25 mEq/L (ref 19–32)
CO2: 27 mEq/L (ref 19–32)
Calcium: 8.6 mg/dL (ref 8.4–10.5)
Calcium: 9.9 mg/dL (ref 8.4–10.5)
Chloride: 104 mEq/L (ref 96–112)
Chloride: 104 mEq/L (ref 96–112)
Chloride: 111 mEq/L (ref 96–112)
Creatinine, Ser: 0.8 mg/dL (ref 0.4–1.2)
Creatinine, Ser: 0.84 mg/dL (ref 0.4–1.2)
Creatinine, Ser: 1.18 mg/dL (ref 0.4–1.2)
GFR calc Af Amer: 52 mL/min — ABNORMAL LOW (ref >60)
Glucose, Bld: 143 mg/dL — ABNORMAL HIGH (ref 70–99)
Glucose, Bld: 154 mg/dL — ABNORMAL HIGH (ref 70–99)

## 2010-11-15 LAB — LIPID PANEL
LDL Cholesterol: 68 mg/dL (ref 0–99)
VLDL: 17 mg/dL (ref 0–40)

## 2010-11-15 LAB — PHOSPHORUS: Phosphorus: 3.1 mg/dL (ref 2.3–4.6)

## 2010-11-15 LAB — TSH: TSH: 1.743 u[IU]/mL (ref 0.350–4.500)

## 2010-11-15 LAB — BRAIN NATRIURETIC PEPTIDE: Pro B Natriuretic peptide (BNP): 175 pg/mL — ABNORMAL HIGH (ref 0.0–100.0)

## 2010-11-15 LAB — APTT: aPTT: 37 seconds (ref 24–37)

## 2010-11-20 LAB — BASIC METABOLIC PANEL
BUN: 18 mg/dL (ref 6–23)
CO2: 28 mEq/L (ref 19–32)
GFR calc non Af Amer: 60 mL/min — ABNORMAL LOW (ref >60)
Glucose, Bld: 97 mg/dL (ref 70–99)
Potassium: 4.6 mEq/L (ref 3.5–5.1)

## 2010-11-20 LAB — CBC
HCT: 32.8 % — ABNORMAL LOW (ref 36.0–46.0)
Hemoglobin: 11.2 g/dL — ABNORMAL LOW (ref 12.0–15.0)
MCHC: 34 g/dL (ref 30.0–36.0)
Platelets: 320 10*3/uL (ref 150–400)
RDW: 13.3 % (ref 11.5–15.5)

## 2010-11-20 LAB — APTT: aPTT: 28 seconds (ref 24–37)

## 2010-12-28 NOTE — Op Note (Signed)
[name], [name]               ACCOUNT NO.:  [id number]   MEDICAL RECORD NO.:  [id number]          PATIENT TYPE:  INP   LOCATION:  5031                         FACILITY:  [facility]   PHYSICIAN:  Burnard Bunting, M.D.    DATE OF BIRTH:  06/10/1918   DATE OF PROCEDURE:  05/29/2008  DATE OF DISCHARGE:                               OPERATIVE REPORT   PREOPERATIVE DIAGNOSIS:  Left hip fracture, 4-part; proximal femur  fracture, intertrochanteric, subtrochanteric.   POSTOPERATIVE DIAGNOSIS:  Left hip fracture, 4-part; proximal femur  fracture, intertrochanteric, subtrochanteric.   PROCEDURE:  Left hip open reduction and internal fixation with  intramedullary hip screw.   SURGEON:  Burnard Bunting, MD   ASSISTANT:  None.   ANESTHESIA:  General endotracheal.   ESTIMATED BLOOD LOSS:  75 mL.   INDICATIONS:  [name] is an ambulatory 77 year old female with  left hip pain after a fall.  She presents now for operative management  of her fracture.   PROCEDURE IN DETAIL:  The patient was brought to the operating room  where general endotracheal anesthesia was induced.  Preoperative  antibiotics were administered.  Time-out was called.  The patient was  placed in a fracture table with the right leg in lithotomy position with  peroneal nerve well padded and the left leg under traction with slight  internal rotation.  The fracture was reduced under fluoroscopic  guidance, in AP and lateral planes.  The area was prescrubbed with  Chlorhexidine and Betadine, which was allowed to air dry and then  prepped with DuraPrep solution.  The operative field was then covered  with Ioban.   Topographic anatomy was identified including the greater trochanter.  The incision was made proximal to the greater trochanter.  Guide pin was  placed through the medial aspect of the greater trochanteric tip and  then advanced across the fracture site.  Proximal canal was reamed,  nailed, which was templated  preoperatively was placed, 10 x 36 cm.  Good  reduction of the fracture was achieved.  The proximal lag screw was then  placed in the center-center portion of the head with tip apex distance  of about approximately 5 mm.  At this time, the 2 proximal incisions  were irrigated.  One distal interlocking screw was placed in the dynamic  hole.  Good fracture reduction was achieved.  All  incisions were irrigated and then closed using interrupted inverted 0  Vicryl suture, 2-0 Vicryl, and skin staples.  Impervious dressing was  placed.  The patient tolerated the procedure well without immediate  complication.  She was transferred to the recovery in stable condition.      Burnard Bunting, M.D.  Electronically Signed     GSD/MEDQ  D:  05/29/2008  T:  05/30/2008  Job:  [id number]

## 2010-12-28 NOTE — Consult Note (Signed)
[name], MIONE               ACCOUNT NO.:  [id number]   MEDICAL RECORD NO.:  [id number]          PATIENT TYPE:  OUT   LOCATION:  MRI                          FACILITY:  [facility]   PHYSICIAN:  Sanjeev K. Deveshwar, M.D.DATE OF BIRTH:  [birth year]   DATE OF CONSULTATION:  07/18/2008  DATE OF DISCHARGE:                                 CONSULTATION   CHIEF COMPLAINT:  Back pain.   HISTORY OF PRESENT ILLNESS:  This is a very pleasant 77 year old female  known to Dr. Corliss Skains from previous compression fractures and  kyphoplasties.  The patient had a T7 kyphoplasty performed on April 18, 2008.  She had a T9 kyphoplasty performed in June 2007.  She had  excellent results with stabilization of the fractures and relief of pain  with both procedures.  Biopsies from both procedures were negative for  malignancies.   The patient fell and fractured her hip in October.  On May 29, 2008,  she had a left hip open reduction and internal fixation performed by Dr.  Dorene Grebe.  Following that she was admitted to [facility]  to undergo physical therapy and reconditioning.  Last Saturday while at  the nursing home, she was changing her clothes.  When she bent over to  take her pants off, she developed sudden onset of low back pain.  She  presents today accompanied by her sister and brother-in-law to be  evaluated.   PAST MEDICAL HISTORY:  Significant for  1. The above-noted kyphoplasty and the left hip open reduction and      internal fixation performed on May 29, 2008.  2. The patient has a history of chronic atrial fibrillation and is on      chronic Coumadin therapy.  Dr. Antoine Poche is her cardiologist.  3. She has diabetes mellitus.  4. Hypertension.  5. Hyperlipidemia.   SURGICAL HISTORY:  The patient had an L4-L5 laminectomy performed in the  past by Dr. Trey Sailors.  She had a left hip ORIF on May 29, 2008  performed by Dr. Dorene Grebe.  The patient denies any  previous problems  with anesthesia.   ALLERGIES:  CODEINE causes nausea.   CURRENT MEDICATIONS:  Vicodin p.r.n. for pain.  The patient believes she  is taking 2-3 tablets per day and she is also on Zocor, Coumadin,  Cardizem, Diovan, Zoloft and an oral medication for diabetes control.   SOCIAL HISTORY:  The patient is widowed.  She has one son and a daughter-  in-law who are very supportive.  The patient was living alone in  [place], [place].  She is now at [facility] with  plans to return home to independent living once she has recovered from  her hip surgery.  She has never used alcohol or tobacco.   FAMILY HISTORY:  Mother died at age 12 from natural causes, her father  died at age 2 from natural causes.   IMPRESSION AND PLAN:  As noted, the patient presents today for further  evaluation of back pain which occurred after bending over to change her  clothes.  She states the pain is 10 on a 1 to 10 scale despite Vicodin  for pain control.  She is comfortable while sedentary but experiences  severe pain with any type of movement.  She localizes the pain across  the low back at approximately her waistline.  Dr. Corliss Skains was  concerned that the patient may have a new fracture.  He recommended an  MRI to be performed today without contrast to rule out another fracture.  As noted, the patient is on Coumadin.  If she does have a new fracture,  we will plan on performing a kyphoplasty or vertebroplasty next Tuesday  or Wednesday.  We will need to stop her Coumadin today or tomorrow for  the procedure.  Further recommendations will be made once we have the  final report on the MRI.   Greater than 30 minutes was spent on this consult.      Delton See, P.A.    ______________________________  Grandville Silos. Corliss Skains, M.D.    DR/MEDQ  D:  07/18/2008  T:  07/19/2008  Job:  [id number]   cc:   Ernestina Penna, M.D.

## 2010-12-28 NOTE — Consult Note (Signed)
NAMEADYSSON, REVELLE               ACCOUNT NO.:  [id number]   MEDICAL RECORD NO.:  [id number]          PATIENT TYPE:  OUT   LOCATION:  MRI                          FACILITY:  [facility]   PHYSICIAN:  Sanjeev K. Deveshwar, M.D.DATE OF BIRTH:  27-Dec-1917   DATE OF CONSULTATION:  DATE OF DISCHARGE:                                 CONSULTATION   CHIEF COMPLAINT:  T9 compression fracture.   HISTORY OF PRESENT ILLNESS:  This is a very pleasant, alert, and active  77 year old female who was referred to Dr. Corliss Skains through the  courtesy of Dr. Rudi Heap for evaluation of a possible new  compression fracture.  The patient has a history of a previous T9  compression fracture that was treated with kyphoplasty in June 2007 by  Dr. Corliss Skains.  The patient had excellent results with almost immediate  relief of pain.   Last week, the patient awoke with back pain in the thoracic area once  again.  She denies any specific injury.  She states the pain is an 8 on  a 1-10 scale.  The patient does live alone.  She usually does her own  cooking and self-care, although her son and daughter-in-law have been  doing her household chores.  Since the fracture, the patient has severe  pain when she is up and around.  She spends most of her time being  sedentary either sitting or lying, which does give her some relief.  She  has been taking Tylenol for the pain.  She also takes a half of a  hydrocodone once a day for the pain.  The patient presents today  accompanied by her son and daughter-in-law for further evaluation of her  back pain.  She did have an MRI just prior to this visit.   PAST MEDICAL HISTORY:  Significant for:  1. Diabetes mellitus.  2. Hypertension.  3. Hyperlipidemia.  4. Chronic atrial fibrillation with chronic Coumadin therapy.  Dr.      Antoine Poche is her cardiologist, although she has not seen him for      sometime.   SURGICAL HISTORY:  The patient had a previous L4-L5 laminectomy  performed by Dr. Trey Sailors.  She denies any previous problems with  anesthesia.   ALLERGIES:  CODEINE causes nausea.   SOCIAL HISTORY:  The patient is widowed.  She has 1 son and a daughter-  in-law who are very supportive.  [place].  She was very independent prior to her compression  fracture.  She has never used alcohol or tobacco.   FAMILY HISTORY:  Her mother died at age 67 from natural causes.  Her  father died at age 36 from natural causes.   CURRENT MEDICATIONS:  1. Hydrocodone one-half tablet daily p.r.n.  2. Coumadin daily.  3. Zocor daily.  4. Glucophage daily.  5. Cardizem CD daily.  6. Iron supplement.  7. Diovan.  8. Zoloft.  9. Valium p.r.n.  10.Vitamin D on a daily basis.   IMPRESSION AND PLAN:  As noted, the patient presents today for further  evaluation  of back pain having undergone a T9 kyphoplasty in June 2007.  The pathology report at that time was negative for malignancy.  The  patient awoke last Tuesday with further back pain between her shoulder  blades.  She had had a chest x-ray in May per the patient's report that  had questioned a compression fracture.  We performed an MRI today, which  was reviewed by Dr. Corliss Skains.  He showed the images to the patient and  her family.  She does have a new T7 compression fracture that appears to  be fairly recent.  Her T9 fracture is stable having undergone a  kyphoplasty 2 years ago.   Dr. Corliss Skains discussed treatment options such as continued pain  medication with sedentary lifestyle versus kyphoplasty or  vertebroplasty.  The patient is anxious to proceed with the intervention  for relief of her pain and stabilization of the fracture.  She has not  taken her Coumadin today.  We would need to hold the Coumadin at least 5  days prior to the intervention.  We told the patient to stay off  Coumadin but to start aspirin 325 mg daily while she is off the  Coumadin.  We  hope to perform her vertebroplasty or kyphoplasty on  Friday of this week.  The patient's daughter-in-law will take her to the  [facility] in [place] on Thursday to have her Protime checked.  They will call the results to [place].  At that time, we will try to make a  decision whether or not to proceed on Friday.  We will also recheck her  protime on Friday morning.   Because of the level of the fracture being T7, which is a fairly small  vertebrae, Dr. Corliss Skains was not certain if kyphoplasty could be  performed, a vertebroplasty might need to be performed instead.  The  patient and family are aware of this.   All of their questions were answered.  Greater than 30 minutes was spent  on this consult.      Delton See, P.A.    ______________________________  Grandville Silos. Corliss Skains, M.D.    DR/MEDQ  D:  04/15/2008  T:  04/16/2008  Job:  [id number]   cc:   Ernestina Penna, M.D.

## 2010-12-28 NOTE — H&P (Signed)
NAMEANJELI, CASAD               ACCOUNT NO.:  [id number]   MEDICAL RECORD NO.:  [id number]          PATIENT TYPE:  INP   LOCATION:  5031                         FACILITY:  [facility]   PHYSICIAN:  Burnard Bunting, M.D.    DATE OF BIRTH:  1918-01-20   DATE OF ADMISSION:  05/28/2008  DATE OF DISCHARGE:                              HISTORY & PHYSICAL   CHIEF COMPLAINT:  Left hip pain.   HISTORY OF PRESENT ILLNESS:  [name] is an 77 year old female, who  lives alone at home, who is ambulatory with assistive device, who fell  while planting mums on May 28, 2008.  She denied any loss of  consciousness.  She had immediate onset of left hip pain which was  moderate in severity.  She denies any numbness and tingling or any other  orthopedic complaints.  She was evaluated by EMS, brought to the  emergency room for further workup.   MEDICATIONS:  Coumadin, Cardizem, Diovan, Glucophage, Tylenol, Zocor,  and Zoloft.   PAST MEDICAL HISTORY:  1. Hypertension.  2. Diabetes.  3. Depression.  4. Atrial fibrillation.  5. Hyperlipidemia.   ALLERGIES:  She is allergic to CODEINE.   SOCIAL HISTORY:  She is a nonsmoker, nondrinker.  No drug abuse.  She  does have a son, who lives here in [place].   PAST SURGICAL HISTORY:  Vertebroplasty x2 earlier this year.   REVIEW OF SYSTEMS:  A 14-organ systems reviewed and are negative.   PHYSICAL EXAMINATION:  GENERAL:  She is a well-developed, well-  nourished.  She is in mild distress.  She is alert and oriented.  The  patient is thin.  VITAL SIGNS:  Blood pressure is 153/78, pulse 99, respirations 18, and  pulse oximetry 96% on room air.  NECK:  Full range of motion.  CHEST:  Clear to auscultation.  CARDIAC:  Heart beats with irregularly irregular heart rate and less  than 90.  ABDOMEN:  Her abdominal exam is benign.  EXTREMITIES:  She has palpable radial pulses.  Good range of motion of  wrists, shoulders, and elbows bilaterally.  She is  holding on her right  leg.  She has bilateral pedal pulses distally.  She has good dorsi and  plantar flexion strength distally with no definite paresthesias on the  dorsoplantar aspect of the feet or hands.  She has no pain with range of  motion of right ankle, knee, or hip.   LABORATORY VALUES:  Sodium 140, potassium 4.4, chloride 104, carbon  dioxide 27, glucose 168, BUN 25, creatinine 0.93.  White count 15.9,  hemoglobin 10.4, platelets 306.  INR is 3.5.  Chest x-ray, no infiltrate  or congestive heart failure.  EKG shows atrial fibrillation, rate  controlled.  AP and lateral pelvis shows subtrochanteric-  intertrochanteric fracture of the left hip with fairly wide femoral  canal.   IMPRESSION:  Left hip intertrochanteric-subtrochanteric fracture in a  patient with significant medical comorbidities including diabetes,  hypertension, and atrial fibrillation.  The patient is ambulatory and  thus will need operative fixation.  Risks and benefits of that have  been  discussed today, including but not limited to infection, nonunion,  malunion, nerve and vessel damage as well as prolonged recovery and a  possible period of nonweightbearing depending on fracture stability.  All questions were answered.  We will proceed with vitamin K initiation  today, and we will recheck her INR and once it is less than or equal to  1.5, we will proceed with surgery.  All questions were answered.      Burnard Bunting, M.D.  Electronically Signed     GSD/MEDQ  D:  05/29/2008  T:  05/29/2008  Job:  [id number]

## 2010-12-28 NOTE — Discharge Summary (Signed)
[name], [name]               ACCOUNT NO.:  [id number]   MEDICAL RECORD NO.:  [id number]          PATIENT TYPE:  INP   LOCATION:  5031                         FACILITY:  [facility]   PHYSICIAN:  Burnard Bunting, M.D.    DATE OF BIRTH:  1918/05/22   DATE OF ADMISSION:  05/28/2008  DATE OF DISCHARGE:  06/02/2008                               DISCHARGE SUMMARY   DISCHARGE DIAGNOSIS:  Left hip fracture.   SECONDARY DIAGNOSES:  1. Hypertension.  2. Diabetes.  3. Depression.  4. Atrial fibrillation.  5. Hyperlipidemia.   OPERATIONS AND PROCEDURES:  Left hip open reduction and internal  fixation, May 29, 2008.   HOSPITAL COURSE:  [name] is an 77 year old ambulatory patient  who fell on the day of admission and broke her left hip.  She was given  vitamin K in order to decrease her INR to less than 1.5 and when that  was achieved, she underwent operative fixation of her fracture.  She  tolerated that procedure well without immediate complications.  The  patient was transfused 2 units packed red bleed cells for hematocrit,  which dropped from 30 to 22.  On postop day #1, she bounced back up to  30 after 2 units of transfusion.  She was mobilized with physical  therapy and started back on Coumadin for DVT prophylaxis.  Her INR was  1.5 at the time of discharge.  Her incision was intact.  She will  maintain a nonweightbearing status on the left lower extremity until a  repeat clinical appointment in 2 weeks.  If radiographs appear stable,  then we will initiate touch-down weightbearing at that time.   DISCHARGE MEDICATIONS:  1. Cardizem 240 mg p.o. daily.  2. Diovan 80 mg p.o. daily.  3. Zocor 1 p.o. daily.  4. Glucophage 500 mg p.o. t.i.d.  5. Coumadin 5 mg p.o. daily.   She will return to clinic in about a week for suture removal.  She was  discharged to a nursing home in good condition.      Burnard Bunting, M.D.  Electronically Signed     GSD/MEDQ  D:   06/02/2008  T:  06/02/2008  Job:  [id number]

## 2010-12-31 NOTE — Discharge Summary (Signed)
[place]. [facility]  Patient:    [name], [name]                      MRN: [id number] Adm. Date:  [id number] Disc. Date: [id number] Attending:  Emeterio Reeve                           Discharge Summary  ADMISSION DIAGNOSIS:  Herniated disc L4-5 on the left.  DISCHARGE DIAGNOSIS:  Herniated disc L4-5 on the left.  OPERATIVE PROCEDURE:  An L4-5 laminectomy discectomy.  SURGEON:  Payton Doughty, M.D.  SERVICE:  Neurosurgery.  COMPLICATIONS:  None.  DISCHARGE STATUS:  Alive and well.  HISTORY OF PRESENT ILLNESS:  A 77 year old right-handed white female whose history and physical is recounted in the chart.  She has had three or four weeks of increasing left lower extremity pain.  MRI showing a large herniated disc at L4-5.  She was admitted for discectomy.  PAST MEDICAL HISTORY:  Benign.  PHYSICAL EXAMINATION:  General physical examination is unremarkable. Neurologic examination shows left L5 radiculopathy.  She was admitted after ascertaining normal laboratory values and underwent an L4-5 laminectomy discectomy.  Postoperatively she has done well. Strength is full.  Her incision is dry.  She has some nausea immediately postoperatively but that has resolved.  She is now up eating and voiding normally.  DISPOSITION:  She is being discharged to home in the care of her family.  DISCHARGE MEDICATIONS:  Darvocet for pain.  FOLLOW-UP:  She will follow up with me in the [facility] office in about a month. DD:  03/15/00 TD:  03/16/00 Job: [id number]

## 2010-12-31 NOTE — Consult Note (Signed)
[name], [name]               ACCOUNT NO.:  [id number]   MEDICAL RECORD NO.:  [id number]          PATIENT TYPE:  OUT   LOCATION:  XRAY                         FACILITY:  [facility]   PHYSICIAN:  Sanjeev K. Deveshwar, M.D.DATE OF BIRTH:  June 23, 1918   DATE OF CONSULTATION:  02/16/2006  DATE OF DISCHARGE:                                   CONSULTATION   DATE OF CONSULT:  February 16, 2006   CHIEF COMPLAINT:  Compression fracture status post T9 kyphoplasty.   HISTORY OF PRESENT ILLNESS:  This is a very pleasant 77 year old female who  developed back pain in late May or early June.  She denied any history of  falling or any other trauma.  An MRI was performed June 6 that showed an  acute T9 compression fracture with a 50% loss of height.  The patient was  referred to Dr. Corliss Skains through the courtesy of Dr. Rudi Heap for  further evaluation and treatment.  On January 31, 2006 the patient had a T9  kyphoplasty performed by Dr. Corliss Skains which she tolerated well.  The  patient returns today accompanied by family members to be seen in follow-up  approximately two weeks after her procedure.   PAST HISTORY:  1.  Diabetes mellitus.  2.  Hyperlipidemia.  3.  Hypertension.  4.  Chronic atrial fibrillation.  5.  She is on Coumadin therapy.  The Coumadin was held for her intervention.  6.  She had a previous L4-L5 laminectomy performed by Dr. Trey Sailors.   ALLERGIES:  CODEINE causes nausea.   SOCIAL HISTORY:  The patient is widowed.  She has one son.  She lives alone  in [place], [place].  She was very independent prior to her  compression fracture.  She has never used alcohol or tobacco.   FAMILY HISTORY:  The patient's mother died at age 23 from natural causes.  Her father died at age 73 from natural causes.   IMPRESSION AND PLAN:  As noted, the patient is seen in follow-up today after  undergoing a T9 kyphoplasty on January 31, 2006.  Her biopsy results were  negative for any  malignancy.  She is now back on her Coumadin.  She reports  that a recent INR was 2.4.   The patient is essentially pain-free, although she describes some tightness  across her lower back at times.  She has been walking for exercise and is  able to walk at least 20 minutes a day.  She is not taking any pain  medication right now except for a hydrocodone at bedtime which she takes  mainly for sleep.  Dr. Corliss Skains suggests that she talk to her primary care  physician to see if he could prescribe something different for sleep rather  than hydrocodone.  She states that she does not tolerate Benadryl, although  she could take some Tylenol.   Overall, the patient is doing very well.  Her appetite is better.  She  recently gained 5 pounds.  She denies any shortness of breath.  She sleeps  well at night when she takes a Vicodin.  She believes she had a bone density  study at some point in the past, but does not remember when this was  performed and this may need to be repeated, although she does report that  she has been intolerant to Fosamax in the past.  She is anxious to start  driving again.  Dr. Corliss Skains did give her permission to drive, although he  wants her to continue to limit her activity and to avoid household chores  such as doing laundry or vacuuming which puts a significant strain on her  back.  She was told that she is at risk for further compression fractures in  the future.  The patient was told to contact us or her primary care  physician if she develops back pain similar to what she experienced with her  previous fracture.   Greater than 15 minutes was spent on this consult.      Delton See, P.A.    ______________________________  Grandville Silos. Corliss Skains, M.D.    DR/MEDQ  D:  02/16/2006  T:  02/16/2006  Job:  [id number]   cc:   Ernestina Penna, M.D.  Fax: [phone]

## 2010-12-31 NOTE — Op Note (Signed)
[place]. [facility]  Patient:    [name], [name]                      MRN: [id number] Proc. Date: 03/14/00 Adm. Date:  [id number] Disc. Date: [id number] Attending:  Emeterio Reeve                           Operative Report  PREOPERATIVE DIAGNOSIS:  Herniated disc at L4-5 left.  POSTOPERATIVE DIAGNOSIS:  Herniated disc at L4-5 left.  OPERATION:  L4-5 laminectomy and diskectomy at L4-5 on the left side.  SURGEON:  Dr. Channing Mutters.  ANESTHESIA:  General endotracheal.  PREP:  Scrubbed with alcohol wipe.  COMPLICATIONS:  None.  HISTORY OF PRESENT ILLNESS:  This patient is a 77 year old right-handed white female with a left L5 radiculopathy and a herniated disc at L4-5.  DESCRIPTION OF PROCEDURE:  She is brought to the operating room, intubated, placed prone on the operating table.  Following shave, prepped and draped in the usual sterile fashion, the skin was infiltrated with 1% lidocaine with 1:100,000 epinephrine, and the skin was incised from the top of L4 to the bottom of L5, and the lamina of L4-5 were exposed on the left side and subperiosteal plane.  Intraoperative x-ray confirmed correctness of level. Hemi semilaminectomy of L4 was carried out up to the top of the ligamentum flavum which was removed in a retrograde fashion.  The lateral aspect of the L5 nerve root was demonstrated, and was dissected free and retracted medially. This exposed a large herniated disc with significant compression of the left L5 root.  The disc was free.  It was grasped and removed without difficulty. The disc space itself was then explored after division of a few remaining annular fibers.  The removal of the large fragment resulted in immediate decompression of the left L5 root, and the disc space was carefully explored, and all graftable fragments were removed.  The L5 root was then explored in all quadrants and found to be free.  The wound was irrigated,  hemostasis assured.  The laminectomy defect covered with a Medrol soaked fat.  The fascia was reapproximated with 0 Vicryl in interrupted fashion.  Subcutaneous tissues were reapproximated with 0 Vicryl in interrupted fashion.  Subcuticular tissues were reapproximated with 3-0 Vicryl in interrupted fashion.  The skin was closed with 4-0 Vicryl in running subcuticular fashion.  Benzoin and Steri-Strips were placed made occlusive with Telfa and Op-Site.  The patient was then returned to the recovery room in good condition. DD:  03/14/00 TD:  03/15/00 Job: [id number]

## 2010-12-31 NOTE — H&P (Signed)
[place]. [facility]  Patient:    [name], [name]                        MRN: [id number] Adm. Date:  03/14/00 Attending:  Payton Doughty, M.D.                         History and Physical  ADMITTING DIAGNOSIS:  Herniated disc, L4-5 on the left.  This 77 year old right-handed white lady, for the past three to four weeks, had  increasing back pain and pain down her left leg posteriorly to the knee. ____________ precipitating event other than a fall several weeks before the pain started.  She does not have anything in her right leg, and her bladder is not affected.  MEDICAL HISTORY/MEDICATIONS:  Remarkable for hypertension, for which she is on Norvasc 2.5 mg q.d., Diovan 80 mg q.d.  She is an adult onset diabetic, for which she takes Amaryl 1 mg q.d. and Zocor 10 mg q.d.  ALLERGIES:  She gets sick in her stomach with CODEINE.  PAST SURGICAL HISTORY:  She has not had any operations.  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  FAMILY HISTORY:  Not given.  REVIEW OF SYSTEMS:  Remarkable for glasses, hypertension, hypercholesterolemia,  back pain, leg pain.  She had a skin cancer in the past.  Diabetes, as noted above.  PHYSICAL EXAMINATION:  HEENT:  Exam within normal limits.  NECK:  She had good range of motion of her neck.  CHEST:  Clear.  CARDIAC:  Regular rate and rhythm, with a 1/6 systolic ejection murmur.  ABDOMEN:  Nontender; no hepatosplenomegaly.  EXTREMITIES:  Without clubbing or cyanosis.  Peripheral pulses are good.  GU:  Exam deferred.    NEUROLOGIC:  She is awake, alert and oriented.  Cranial nerves are intact. Motor exam has 5/5 strength throughout the upper and lower extremities save for mild dorsiflexion weakness in the left foot.  There is no current sensory deficit. Reflexes are ______ at the knee, absent at the ankles.  Straight leg raise is negative.  She  cannot stand to walk because of back and leg pain.  MRI shows a herniated disc which is largely eccentric to the left at L4-5 with compression of the L5-S1 ridge.  CLINICAL IMPRESSION:  Left L5 radiculopathy secondary to L4-5 disc.  PLAN:  Lumbar laminectomy/discectomy.  The risks and benefits of this approach have been discussed with her and she wishes to proceed. DD:  03/14/00 TD:  03/14/00 Job: [id number]

## 2010-12-31 NOTE — Consult Note (Signed)
[name], [name]               ACCOUNT NO.:  [id number]   MEDICAL RECORD NO.:  [id number]          PATIENT TYPE:  OUT   LOCATION:  XRAY                         FACILITY:  [facility]   PHYSICIAN:  Sanjeev K. Deveshwar, M.D.DATE OF BIRTH:  04-25-1918   DATE OF CONSULTATION:  01/25/2006  DATE OF DISCHARGE:                                   CONSULTATION   CHIEF COMPLAINT:  Compression fracture.   HISTORY OF PRESENT ILLNESS:  This is a very pleasant 77 year old female who  woke up with back pain approximately three weeks ago.  She denies any recent  falls.  She had an MRI performed on January 18, 2006, that showed an acute T9  compression fracture with a 50% loss of height.  She is currently taking  Vicodin was some relief, however, she continues to have debilitating pain.  She has been referred to Dr. Corliss Skains through the courtesy of Dr. Christell Constant for  further evaluation and treatment.   PAST MEDICAL HISTORY:  1.  Diabetes mellitus.  2.  Hyperlipidemia.  3.  Hypertension.  4.  She has a history of atrial fibrillation and is on chronic Coumadin      therapy.  She has seen Dr. Rollene Rotunda for this problem but is      currently being followed by Dr. Vernon Prey.   PAST SURGICAL HISTORY:  Significant for an L4-L5 laminectomy performed in  2001 by Dr. Trey Sailors.  She has had no problems with anesthesia.   ALLERGIES:  CODEINE makes her nauseated.  She denies any allergies to  contrast dye, to latex, iodine or shellfish.   CURRENT MEDICATIONS:  Include Coumadin hydrocodone Zocor, Glucophage,  Cardizem and Diovan.   SOCIAL HISTORY:  The patient is widowed.  She has one son.  She lives alone  in [place], [place].  She was very independent prior to this  fracture.  She has never used alcohol or tobacco.   FAMILY HISTORY:  Mother died at age 73 from natural causes.  Her father died  at age 3 from natural causes.   IMPRESSION AND PLAN:  The patient is accompanied by her son and  daughter-in-  law today.  Dr. Corliss Skains reviewed the results of the MRI with the patient  and her family.  He pointed out the T9 compression fracture.  The  kyphoplasty procedure was described in great detail along with the risks and  benefits.  The patient is anxious to proceed for relief of pain and  stabilization of the fracture.  The patient is felt to be a good candidate,  although she is on Coumadin which will need to be held for the intervention.   The patient has been tentatively scheduled to undergo the kyphoplasty on  January 31, 2006.  She was instructed to hold her Coumadin after tonight's  dose.  In the meantime she is to start aspirin 325 mg daily until after the  kyphoplasty.  She is to hold aspirin and Coumadin on February 10, 2006, and  resume her Coumadin the evening of January 31, 2006.   Greater than 40 minutes  was spent on this consult.      Delton See, P.A.    ______________________________  Grandville Silos. Corliss Skains, M.D.    DR/MEDQ  D:  01/25/2006  T:  01/25/2006  Job:  [id number]   cc:   Rollene Rotunda, M.D.  [address]  [address]  [place]  [place] [address]   Ernestina Penna, M.D.  Fax: [phone]

## 2011-05-16 LAB — BASIC METABOLIC PANEL
BUN: 16
BUN: 19
BUN: 24 — ABNORMAL HIGH
CO2: 27
Calcium: 9.3
Chloride: 104
Chloride: 105
Creatinine, Ser: 0.88
Creatinine, Ser: 0.9
GFR calc non Af Amer: >60
Glucose, Bld: 145 — ABNORMAL HIGH
Glucose, Bld: 169 — ABNORMAL HIGH
Potassium: 4.4
Potassium: 4.6

## 2011-05-16 LAB — PROTIME-INR
INR: 1.4
INR: 1.5
INR: 1.7 — ABNORMAL HIGH
INR: 3.5 — ABNORMAL HIGH
Prothrombin Time: 15.9 — ABNORMAL HIGH
Prothrombin Time: 18.5 — ABNORMAL HIGH
Prothrombin Time: 22.6 — ABNORMAL HIGH
Prothrombin Time: 38.2 — ABNORMAL HIGH

## 2011-05-16 LAB — CBC
HCT: 20.1 — ABNORMAL LOW
HCT: 30.5 — ABNORMAL LOW
Hemoglobin: 10.4 — ABNORMAL LOW
MCHC: 34
MCV: 88.2
MCV: 88.3
MCV: 89.7
Platelets: 186
Platelets: 212
RBC: 3.45 — ABNORMAL LOW
RDW: 13.5
RDW: 13.8
RDW: 14.6
WBC: 9.4

## 2011-05-16 LAB — DIFFERENTIAL
Eosinophils Absolute: 0
Lymphs Abs: 1.1
Monocytes Relative: 5
Neutro Abs: 14 — ABNORMAL HIGH
Neutrophils Relative %: 88 — ABNORMAL HIGH

## 2011-05-16 LAB — GLUCOSE, CAPILLARY
Glucose-Capillary: 117 — ABNORMAL HIGH
Glucose-Capillary: 119 — ABNORMAL HIGH
Glucose-Capillary: 133 — ABNORMAL HIGH
Glucose-Capillary: 137 — ABNORMAL HIGH
Glucose-Capillary: 144 — ABNORMAL HIGH
Glucose-Capillary: 177 — ABNORMAL HIGH
Glucose-Capillary: 180 — ABNORMAL HIGH
Glucose-Capillary: 181 — ABNORMAL HIGH
Glucose-Capillary: 181 — ABNORMAL HIGH
Glucose-Capillary: 189 — ABNORMAL HIGH

## 2011-05-16 LAB — APTT: aPTT: 41 — ABNORMAL HIGH

## 2011-05-16 LAB — PREPARE FRESH FROZEN PLASMA

## 2011-05-16 LAB — COMPREHENSIVE METABOLIC PANEL
ALT: 13
BUN: 25 — ABNORMAL HIGH
CO2: 27
Calcium: 9.9
Creatinine, Ser: 0.93
GFR calc non Af Amer: 57 — ABNORMAL LOW
Glucose, Bld: 168 — ABNORMAL HIGH
Total Protein: 6.7

## 2011-05-16 LAB — URINE CULTURE
Colony Count: NO GROWTH
Culture: NO GROWTH

## 2011-05-16 LAB — URINALYSIS, ROUTINE W REFLEX MICROSCOPIC
Protein, ur: NEGATIVE
Specific Gravity, Urine: 1.029
Urobilinogen, UA: 0.2

## 2011-05-16 LAB — TYPE AND SCREEN: Antibody Screen: NEGATIVE

## 2011-05-18 LAB — PROTIME-INR
INR: 1.4
Prothrombin Time: 18 — ABNORMAL HIGH

## 2011-05-18 LAB — CREATININE, SERUM
Creatinine, Ser: 1.01
GFR calc Af Amer: >60
GFR calc non Af Amer: 52 — ABNORMAL LOW

## 2011-05-18 LAB — CBC
MCV: 87.7
Platelets: 297
RBC: 3.81 — ABNORMAL LOW
WBC: 8.7

## 2011-05-18 LAB — BUN: BUN: 18

## 2011-05-18 LAB — BASIC METABOLIC PANEL
BUN: 21
Calcium: 10.1
Creatinine, Ser: 0.95
GFR calc Af Amer: >60
GFR calc non Af Amer: 56 — ABNORMAL LOW

## 2011-05-18 LAB — GLUCOSE, CAPILLARY: Glucose-Capillary: 105 — ABNORMAL HIGH

## 2011-05-19 LAB — BASIC METABOLIC PANEL
BUN: 20 mg/dL (ref 6–23)
Creatinine, Ser: 0.96 mg/dL (ref 0.4–1.2)
GFR calc non Af Amer: 55 mL/min — ABNORMAL LOW (ref >60)
Glucose, Bld: 127 mg/dL — ABNORMAL HIGH (ref 70–99)
Potassium: 4 mEq/L (ref 3.5–5.1)

## 2011-05-19 LAB — GLUCOSE, CAPILLARY
Glucose-Capillary: 118 mg/dL — ABNORMAL HIGH (ref 70–99)
Glucose-Capillary: 127 mg/dL — ABNORMAL HIGH (ref 70–99)

## 2011-05-19 LAB — PROTIME-INR: INR: 1.2 (ref 0.00–1.49)

## 2011-05-19 LAB — CBC
MCHC: 33.8 g/dL (ref 30.0–36.0)
WBC: 8.6 10*3/uL (ref 4.0–10.5)

## 2012-11-06 ENCOUNTER — Other Ambulatory Visit: Payer: Self-pay | Admitting: *Deleted

## 2012-11-06 MED ORDER — ZOLPIDEM TARTRATE 5 MG PO TABS: 5.0000 mg | ORAL_TABLET | Freq: Every evening | ORAL | Status: DC | PRN

## 2012-11-08 ENCOUNTER — Telehealth: Payer: Self-pay | Admitting: Family Medicine

## 2012-11-08 NOTE — Telephone Encounter (Signed)
Rx for Ambien called into [facility] at [facility]

## 2012-11-08 NOTE — Telephone Encounter (Signed)
Was told her rxs would be faxed to [facility]. Not done yet.

## 2012-12-05 ENCOUNTER — Other Ambulatory Visit: Payer: Self-pay | Admitting: Family Medicine

## 2013-01-14 ENCOUNTER — Inpatient Hospital Stay (HOSPITAL_COMMUNITY): Payer: Medicare Other

## 2013-01-14 ENCOUNTER — Inpatient Hospital Stay (HOSPITAL_COMMUNITY)
Admission: EM | Admit: 2013-01-14 | Discharge: 2013-01-16 | DRG: 065 | Disposition: A | Discharge status: Home Health | Payer: Medicare Other | Attending: Neurology | Admitting: Neurology

## 2013-01-14 ENCOUNTER — Emergency Department (HOSPITAL_COMMUNITY): Payer: Medicare Other

## 2013-01-14 ENCOUNTER — Encounter (HOSPITAL_COMMUNITY): Payer: Self-pay

## 2013-01-14 DIAGNOSIS — R488 Other symbolic dysfunctions: Secondary | ICD-10-CM | POA: Diagnosis present

## 2013-01-14 DIAGNOSIS — I4891 Unspecified atrial fibrillation: Secondary | ICD-10-CM | POA: Diagnosis present

## 2013-01-14 DIAGNOSIS — Z823 Family history of stroke: Secondary | ICD-10-CM

## 2013-01-14 DIAGNOSIS — Z9282 Status post administration of tPA (rtPA) in a different facility within the last 24 hours prior to admission to current facility: Secondary | ICD-10-CM

## 2013-01-14 DIAGNOSIS — E785 Hyperlipidemia, unspecified: Secondary | ICD-10-CM | POA: Diagnosis present

## 2013-01-14 DIAGNOSIS — S42209A Unspecified fracture of upper end of unspecified humerus, initial encounter for closed fracture: Secondary | ICD-10-CM

## 2013-01-14 DIAGNOSIS — Z8673 Personal history of transient ischemic attack (TIA), and cerebral infarction without residual deficits: Secondary | ICD-10-CM

## 2013-01-14 DIAGNOSIS — E1142 Type 2 diabetes mellitus with diabetic polyneuropathy: Secondary | ICD-10-CM | POA: Diagnosis present

## 2013-01-14 DIAGNOSIS — Z7901 Long term (current) use of anticoagulants: Secondary | ICD-10-CM

## 2013-01-14 DIAGNOSIS — F3289 Other specified depressive episodes: Secondary | ICD-10-CM | POA: Diagnosis present

## 2013-01-14 DIAGNOSIS — I639 Cerebral infarction, unspecified: Secondary | ICD-10-CM

## 2013-01-14 DIAGNOSIS — I10 Essential (primary) hypertension: Secondary | ICD-10-CM | POA: Diagnosis present

## 2013-01-14 DIAGNOSIS — F329 Major depressive disorder, single episode, unspecified: Secondary | ICD-10-CM | POA: Diagnosis present

## 2013-01-14 DIAGNOSIS — R29898 Other symptoms and signs involving the musculoskeletal system: Secondary | ICD-10-CM | POA: Diagnosis present

## 2013-01-14 DIAGNOSIS — E1149 Type 2 diabetes mellitus with other diabetic neurological complication: Secondary | ICD-10-CM | POA: Diagnosis present

## 2013-01-14 DIAGNOSIS — I6340 Cerebral infarction due to embolism of unspecified cerebral artery: Principal | ICD-10-CM | POA: Diagnosis present

## 2013-01-14 DIAGNOSIS — K5900 Constipation, unspecified: Secondary | ICD-10-CM | POA: Diagnosis present

## 2013-01-14 DIAGNOSIS — S72009A Fracture of unspecified part of neck of unspecified femur, initial encounter for closed fracture: Secondary | ICD-10-CM

## 2013-01-14 DIAGNOSIS — I6350 Cerebral infarction due to unspecified occlusion or stenosis of unspecified cerebral artery: Secondary | ICD-10-CM

## 2013-01-14 DIAGNOSIS — R4701 Aphasia: Secondary | ICD-10-CM | POA: Diagnosis present

## 2013-01-14 DIAGNOSIS — Z79899 Other long term (current) drug therapy: Secondary | ICD-10-CM

## 2013-01-14 LAB — CBC
MCH: 29.1 pg (ref 26.0–34.0)
RBC: 4.47 MIL/uL (ref 3.87–5.11)
RDW: 13.2 % (ref 11.5–15.5)
WBC: 10.2 10*3/uL (ref 4.0–10.5)

## 2013-01-14 LAB — DIFFERENTIAL
Basophils Relative: 1 % (ref 0–1)
Eosinophils Relative: 1 % (ref 0–5)
Lymphocytes Relative: 20 % (ref 12–46)
Monocytes Absolute: 0.4 10*3/uL (ref 0.1–1.0)
Monocytes Relative: 4 % (ref 3–12)
Neutro Abs: 7.7 10*3/uL (ref 1.7–7.7)

## 2013-01-14 LAB — POCT I-STAT, CHEM 8
BUN: 22 mg/dL (ref 6–23)
Chloride: 105 mEq/L (ref 96–112)
Potassium: 4.5 mEq/L (ref 3.5–5.1)
Sodium: 141 mEq/L (ref 135–145)
TCO2: 26 mmol/L (ref 0–100)

## 2013-01-14 LAB — COMPREHENSIVE METABOLIC PANEL
BUN: 21 mg/dL (ref 6–23)
CO2: 27 mEq/L (ref 19–32)
Calcium: 10.1 mg/dL (ref 8.4–10.5)
Chloride: 100 mEq/L (ref 96–112)
Creatinine, Ser: 0.97 mg/dL (ref 0.50–1.10)
GFR calc Af Amer: 57 mL/min — ABNORMAL LOW (ref >90)
GFR calc non Af Amer: 49 mL/min — ABNORMAL LOW (ref >90)
Total Bilirubin: 0.3 mg/dL (ref 0.3–1.2)

## 2013-01-14 LAB — URINALYSIS, ROUTINE W REFLEX MICROSCOPIC
Glucose, UA: 250 mg/dL — AB
Hgb urine dipstick: NEGATIVE
Specific Gravity, Urine: 1.01 (ref 1.005–1.030)
Urobilinogen, UA: 0.2 mg/dL (ref 0.0–1.0)
pH: 6 (ref 5.0–8.0)

## 2013-01-14 LAB — POCT I-STAT TROPONIN I: Troponin i, poc: 0 ng/mL (ref 0.00–0.08)

## 2013-01-14 LAB — PROTIME-INR
INR: 1.04 (ref 0.00–1.49)
Prothrombin Time: 13.5 seconds (ref 11.6–15.2)

## 2013-01-14 LAB — RAPID URINE DRUG SCREEN, HOSP PERFORMED
Cocaine: NOT DETECTED
Opiates: NOT DETECTED

## 2013-01-14 LAB — TROPONIN I: Troponin I: <0.3 ng/mL (ref <0.30)

## 2013-01-14 LAB — GLUCOSE, CAPILLARY: Glucose-Capillary: 222 mg/dL — ABNORMAL HIGH (ref 70–99)

## 2013-01-14 MED ORDER — ALTEPLASE (STROKE) FULL DOSE INFUSION
0.9000 mg/kg | Freq: Once | INTRAVENOUS | Status: AC
  Administered 2013-01-14: 47 mg via INTRAVENOUS
  Filled 2013-01-14: qty 47

## 2013-01-14 MED ORDER — ALTEPLASE 100 MG IV SOLR
INTRAVENOUS | Status: AC
  Filled 2013-01-14: qty 1

## 2013-01-14 MED ORDER — SODIUM CHLORIDE 0.9 % IV SOLN
INTRAVENOUS | Status: DC
  Administered 2013-01-14 – 2013-01-15 (×2): via INTRAVENOUS

## 2013-01-14 MED ORDER — SODIUM CHLORIDE 0.9 % IV SOLN
250.0000 mL | Freq: Once | INTRAVENOUS | Status: AC
  Administered 2013-01-14: 250 mL via INTRAVENOUS

## 2013-01-14 MED ORDER — ALTEPLASE (STROKE) FULL DOSE INFUSION
0.9000 mg/kg | Freq: Once | INTRAVENOUS | Status: DC
  Filled 2013-01-14: qty 49

## 2013-01-14 MED ORDER — PANTOPRAZOLE SODIUM 40 MG IV SOLR
40.0000 mg | Freq: Every day | INTRAVENOUS | Status: DC
  Administered 2013-01-14: 40 mg via INTRAVENOUS
  Filled 2013-01-14 (×2): qty 40

## 2013-01-14 MED ORDER — ACETAMINOPHEN 325 MG PO TABS
650.0000 mg | ORAL_TABLET | ORAL | Status: DC | PRN
  Administered 2013-01-14 – 2013-01-15 (×2): 650 mg via ORAL
  Filled 2013-01-14 (×2): qty 2

## 2013-01-14 MED ORDER — DILTIAZEM HCL ER COATED BEADS 240 MG PO CP24
240.0000 mg | ORAL_CAPSULE | Freq: Every day | ORAL | Status: DC
  Administered 2013-01-15 – 2013-01-16 (×2): 240 mg via ORAL
  Filled 2013-01-14 (×2): qty 1

## 2013-01-14 MED ORDER — ONDANSETRON HCL 4 MG/2ML IJ SOLN: 4.0000 mg | Freq: Four times a day (QID) | INTRAMUSCULAR | Status: DC | PRN

## 2013-01-14 MED ORDER — INSULIN ASPART 100 UNIT/ML ~~LOC~~ SOLN
2.0000 [IU] | SUBCUTANEOUS | Status: DC
  Administered 2013-01-14: 4 [IU] via SUBCUTANEOUS

## 2013-01-14 MED ORDER — ACETAMINOPHEN 650 MG RE SUPP: 650.0000 mg | RECTAL | Status: DC | PRN

## 2013-01-14 MED ORDER — SENNOSIDES-DOCUSATE SODIUM 8.6-50 MG PO TABS
1.0000 | ORAL_TABLET | Freq: Every evening | ORAL | Status: DC | PRN
  Filled 2013-01-14: qty 1

## 2013-01-14 MED ORDER — LABETALOL HCL 5 MG/ML IV SOLN
10.0000 mg | INTRAVENOUS | Status: DC | PRN
  Administered 2013-01-14 – 2013-01-15 (×2): 10 mg via INTRAVENOUS
  Filled 2013-01-14 (×2): qty 4

## 2013-01-14 NOTE — Progress Notes (Signed)
Nursing 1730 Patient continues to have headache, Dr. Amada Jupiter aware.  STAT CT ordered. Patient taken to CT.  Will continue to assess.

## 2013-01-14 NOTE — ED Provider Notes (Addendum)
History     This chart was scribed for Hilario Quarry, MD, MD by Smitty Pluck, ED Scribe. The patient was seen in room APA05/APA05 and the patient's care was started at 10:56 AM.   CSN: [id number]  Arrival date & time 01/14/13  1046    level 5  Chief Complaint  Patient presents with  . Cerebrovascular Accident     Patient is a 79 y.o. female presenting with Acute Neurological Problem. The history is provided by medical records and a caregiver. No language interpreter was used.  Cerebrovascular Accident This is a new problem. The current episode started 1 to 2 hours ago. The problem occurs constantly. The problem has not changed since onset.Nothing aggravates the symptoms. Nothing relieves the symptoms. She has tried nothing for the symptoms.   HPI Comments: [name] is a 79 y.o. female with hx of DM and HTN who presents to the Emergency Department complaining of confusion and slurred speech today at 9:40 AM. Pt lives at home and at home nurse reports that today she was doing crossword puzzle and suddenly became confused. Nurse reports pt normally walks without assistance and was at baseline the last time she saw her. Nurse states pt had normal appetite today. Pt was able to move around without assistance upon arrival in ED. Nurse denies recent fever, chills, nausea, vomiting, diarrhea, weakness, cough, SOB and any other pain.  PCP is Dr. Christell Constant  Past Medical History  Diagnosis Date  . Diabetes mellitus   . Hypertension     History reviewed. No pertinent past surgical history.  No family history on file.  History  Substance Use Topics  . Smoking status: Never Smoker   . Smokeless tobacco: Not on file  . Alcohol Use: No    OB History   Grav Para Term Preterm Abortions TAB SAB Ect Mult Living                  Review of Systems  Unable to perform ROS: Other    Allergies  Codeine  Home Medications   Current Outpatient Rx  Name  Route  Sig  Dispense  Refill  .  aspirin EC 81 MG tablet   Oral   Take 81 mg by mouth daily.         Marland Kitchen diltiazem (DILACOR XR) 240 MG 24 hr capsule   Oral   Take 240 mg by mouth daily.         . metFORMIN (GLUCOPHAGE-XR) 500 MG 24 hr tablet   Oral   Take 500 mg by mouth daily with breakfast. Take with largest meal.         . sertraline (ZOLOFT) 100 MG tablet   Oral   Take 100 mg by mouth daily.         Marland Kitchen zolpidem (AMBIEN) 5 MG tablet   Oral   Take 1 tablet (5 mg total) by mouth at bedtime as needed for sleep.   30 tablet   5     BP 164/84  Ht 5\' 4"  (1.626 m)  Wt 120 lb (54.432 kg)  BMI 20.59 kg/m2  SpO2 98%  Physical Exam  Nursing note and vitals reviewed. Constitutional: No distress.  HENT:  Head: Normocephalic and atraumatic.  Eyes: Conjunctivae are normal. Pupils are equal, round, and reactive to light.  Neck: Normal range of motion. Neck supple.  Cardiovascular: Normal rate, regular rhythm and normal heart sounds.   Pulmonary/Chest: Effort normal and breath sounds normal. No  respiratory distress. She has no wheezes. She has no rales.  Abdominal: Soft. She exhibits no distension. There is no tenderness. There is no rebound and no guarding.  Neurological: She is alert. She has normal strength.  Reflex Scores:      Tricep reflexes are 2+ on the right side and 2+ on the left side.      Bicep reflexes are 2+ on the right side and 2+ on the left side.      Brachioradialis reflexes are 2+ on the right side and 2+ on the left side.      Patellar reflexes are 2+ on the right side and 2+ on the left side.      Achilles reflexes are 2+ on the right side and 2+ on the left side. Loc aler Unable to answer questions Performs one task correctly Gaze-0 unable to get to look to sides Visual no acute loss to identify Facial palsy none  No arm drift Motor leg- no drift unalbe to assess limb ataxia Sensory appears normal Severe aphasia nih stroke score 5   Skin: Skin is warm and dry.    ED  Course  Procedures (including critical care time) DIAGNOSTIC STUDIES: Oxygen Saturation is 98% on room air, normal by my interpretation.    COORDINATION OF CARE: 11:02 AM Discussed ED treatment with pt's nurse and nurse agrees.     Labs Reviewed - No data to display No results found.   No diagnosis found. Results for orders placed during the hospital encounter of 01/14/13  ETHANOL      Result Value Range   Alcohol, Ethyl (B) <11  0 - 11 mg/dL  PROTIME-INR      Result Value Range   Prothrombin Time 13.5  11.6 - 15.2 seconds   INR 1.04  0.00 - 1.49  APTT      Result Value Range   aPTT 28  24 - 37 seconds  CBC      Result Value Range   WBC 10.2  4.0 - 10.5 K/uL   RBC 4.47  3.87 - 5.11 MIL/uL   Hemoglobin 13.0  12.0 - 15.0 g/dL   HCT 40.9  81.1 - 91.4 %   MCV 86.1  78.0 - 100.0 fL   MCH 29.1  26.0 - 34.0 pg   MCHC 33.8  30.0 - 36.0 g/dL   RDW 78.2  95.6 - 21.3 %   Platelets 257  150 - 400 K/uL  DIFFERENTIAL      Result Value Range   Neutrophils Relative % 75  43 - 77 %   Neutro Abs 7.7  1.7 - 7.7 K/uL   Lymphocytes Relative 20  12 - 46 %   Lymphs Abs 2.0  0.7 - 4.0 K/uL   Monocytes Relative 4  3 - 12 %   Monocytes Absolute 0.4  0.1 - 1.0 K/uL   Eosinophils Relative 1  0 - 5 %   Eosinophils Absolute 0.1  0.0 - 0.7 K/uL   Basophils Relative 1  0 - 1 %   Basophils Absolute 0.1  0.0 - 0.1 K/uL  COMPREHENSIVE METABOLIC PANEL      Result Value Range   Sodium 139  135 - 145 mEq/L   Potassium 4.8  3.5 - 5.1 mEq/L   Chloride 100  96 - 112 mEq/L   CO2 27  19 - 32 mEq/L   Glucose, Bld 229 (*) 70 - 99 mg/dL   BUN 21  6 - 23 mg/dL  Creatinine, Ser 0.97  0.50 - 1.10 mg/dL   Calcium 78.4  8.4 - 69.6 mg/dL   Total Protein 8.3  6.0 - 8.3 g/dL   Albumin 4.4  3.5 - 5.2 g/dL   AST 21  0 - 37 U/L   ALT 9  0 - 35 U/L   Alkaline Phosphatase 108  39 - 117 U/L   Total Bilirubin 0.3  0.3 - 1.2 mg/dL   GFR calc non Af Amer 49 (*) >90 mL/min   GFR calc Af Amer 57 (*) >90 mL/min   TROPONIN I      Result Value Range   Troponin I <0.30  <0.30 ng/mL  URINE RAPID DRUG SCREEN (HOSP PERFORMED)      Result Value Range   Opiates NONE DETECTED  NONE DETECTED   Cocaine NONE DETECTED  NONE DETECTED   Benzodiazepines NONE DETECTED  NONE DETECTED   Amphetamines NONE DETECTED  NONE DETECTED   Tetrahydrocannabinol NONE DETECTED  NONE DETECTED   Barbiturates NONE DETECTED  NONE DETECTED  URINALYSIS, ROUTINE W REFLEX MICROSCOPIC      Result Value Range   Color, Urine YELLOW  YELLOW   APPearance CLEAR  CLEAR   Specific Gravity, Urine 1.010  1.005 - 1.030   pH 6.0  5.0 - 8.0   Glucose, UA 250 (*) NEGATIVE mg/dL   Hgb urine dipstick NEGATIVE  NEGATIVE   Bilirubin Urine NEGATIVE  NEGATIVE   Ketones, ur NEGATIVE  NEGATIVE mg/dL   Protein, ur NEGATIVE  NEGATIVE mg/dL   Urobilinogen, UA 0.2  0.0 - 1.0 mg/dL   Nitrite NEGATIVE  NEGATIVE   Leukocytes, UA NEGATIVE  NEGATIVE  GLUCOSE, CAPILLARY      Result Value Range   Glucose-Capillary 222 (*) 70 - 99 mg/dL  POCT I-STAT, CHEM 8      Result Value Range   Sodium 141  135 - 145 mEq/L   Potassium 4.5  3.5 - 5.1 mEq/L   Chloride 105  96 - 112 mEq/L   BUN 22  6 - 23 mg/dL   Creatinine, Ser 2.95  0.50 - 1.10 mg/dL   Glucose, Bld 284 (*) 70 - 99 mg/dL   Calcium, Ion 1.32  4.40 - 1.30 mmol/L   TCO2 26  0 - 100 mmol/L   Hemoglobin 13.3  12.0 - 15.0 g/dL   HCT 10.2  72.5 - 36.6 %  POCT I-STAT TROPONIN I      Result Value Range   Troponin i, poc 0.00  0.00 - 0.08 ng/mL   Comment 3              Initally paged as code stroke.  Discussed with neuro md on call and advised given patient's age and nonlateralized symptoms to have specialists on call evaluate.   MDM  11:30 AM Discussed with specialists on call triage md   11:33 AM Discussed with radiologist and no evidence of acute infarct or bleed on ct.   Hilario Quarry, MD 01/14/13 1132  Discussed with soc and they have discussed tpa risks with family who wish to proceed  with tpa.  I have discussed and placed order for tpa with pharmacist. 11:58 AM Patient accepted by Dr. Amada Jupiter to 3100 bed.  [facility] is dispatching. 12:07 PM   Elderly female with sudden onset of severe aphasia- evaluated by me and soc and consulted as code stroke.  Symptoms are within 3 hours of arrival and currently at 2.5 hours from onset with tpa ordered  at 2 hours 18 minutes.  Patient accepted by Dr.Kirkpatrick to 3100 at [facility]  CRITICAL CARE Performed by: Hilario Quarry Total critical care time: 75 Critical care time was exclusive of separately billable procedures and treating other patients. Critical care was necessary to treat or prevent imminent or life-threatening deterioration. Critical care was time spent personally by me on the following activities: development of treatment plan with patient and/or surrogate as well as nursing, discussions with consultants, evaluation of patient's response to treatment, examination of patient, obtaining history from patient or surrogate, ordering and performing treatments and interventions, ordering and review of laboratory studies, ordering and review of radiographic studies, pulse oximetry and re-evaluation of patient's condition.   I personally performed the services described in this documentation, which was scribed in my presence. The recorded information has been reviewed and considered.   Hilario Quarry, MD 01/14/13 1213

## 2013-01-14 NOTE — ED Notes (Signed)
Report called to [place], RN on unit 3100 at [facility].

## 2013-01-14 NOTE — H&P (Addendum)
Neurology H&P Reason for Consult: aphasia  CC: Aphasia  History is obtained from:patient, son, caretake  HPI: [name] is a 79 y.o. female who was working a crossword at 9:40 am when she had sudden onset difficulty. Caregiver was with her at the time. Her symptoms were persistent without improvement until tPA was started 2:59 minutes after time of onset. Since that time, her son reports that she has had some improvement, indeed, between my initial exam and subsequent exam 15 minutes later, she had significant improvement.     LKW: 9:40 am.  tpa given: yes.  NIHSS: 4    ROS: limited due to aphasia   Past Medical History  Diagnosis Date  . Diabetes mellitus   . Hypertension     Family History: + for stroke  Social History: Tob: denies  Exam: Current vital signs: BP 153/76  Pulse 69  Temp(Src) 98.6 F (37 C) (Oral)  Resp 15  Ht 5\' 4"  (1.626 m)  Wt 52.5 kg (115 lb 11.9 oz)  BMI 19.86 kg/m2  SpO2 99% Vital signs in last 24 hours: Temp:  [98 F (36.7 C)-98.6 F (37 C)] 98.6 F (37 C) (06/02 1546) Pulse Rate:  [59-83] 69 (06/02 1600) Resp:  [12-18] 15 (06/02 1600) BP: (146-184)/(50-84) 153/76 mmHg (06/02 1600) SpO2:  [98 %-100 %] 99 % (06/02 1600) Weight:  [52.164 kg (115 lb)-54.432 kg (120 lb)] 52.5 kg (115 lb 11.9 oz) (06/02 1330)  General: In bed, NAD CV: irregular Mental Status: Patient is awake, alert, has both expressive and receptive components to an aphasia. This improves over the course of the exam.  She has no neglect. She is able to follow commands, but has to have instructions repeated.  Cranial Nerves: II: Visual Fields are full. Pupils are equal, round, and reactive to light.  Discs are difficult to visualize. III,IV, VI: EOMI without ptosis or diploplia.  V: Facial sensation is symmetric to temperature VII: Facial movement is symmetric.  VIII: hearing is intact to voice X: Uvula elevates symmetrically XI: Shoulder shrug is  symmetric. XII: tongue is midline without atrophy or fasciculations.  Motor: Tone is normal. Bulk is normal. 5/5 strength was present in all four extremities.  Sensory: Sensation is symmetric to light touch in the arms and legs. Cerebellar: FNF with some tremor, but no ataxia bilaterally Gait: Not tested due to bed rest from tPA.    I have reviewed labs in epic and the results pertinent to this consultation are: CMP - elevated glucose.    I have reviewed the images obtained:CT head - negative for hemorrhage  Impression: 79 yo F with sudden onset aphasia consistent with a small frontal infarct s/p tPA. Her improvement after start of tPA is hopeful, but she still does have some deficit. At this time, she is being admitted to the ICU for monitoring.   Recommendations: 1) May need to restart anticoagulation.  2) no asa for 24 hours from tpa 3) admit to ICU 4) frequent neuro checks, if change in status, repeat CT 5) MRI/MRA 6) lipid panel, a1c in the morning 7) carotid dopplers/echo 8) Restart home rate control medication(diltiazem) tomorrow.   This patient is critically ill and at significant risk of neurological worsening, death and care requires constant monitoring of vital signs, hemodynamics,respiratory and cardiac monitoring, neurological assessment, discussion with family, other specialists and medical decision making of high complexity. I spent 35 minutes of neurocritical care time  in the care of  this patient.  Ritta Slot,  MD [facility] [phone]  If 7pm- 7am, please page neurology on call at [phone].  01/14/2013  4:30 PM

## 2013-01-14 NOTE — ED Notes (Signed)
Pt lives at home, became suddenly confused with slurred speech.  Pt last known normal approx 0940

## 2013-01-14 NOTE — Progress Notes (Signed)
*  PRELIMINARY RESULTS* Echocardiogram 2D Echocardiogram has been performed.  [name] 01/14/2013, 4:46 PM

## 2013-01-15 ENCOUNTER — Inpatient Hospital Stay (HOSPITAL_COMMUNITY): Payer: Medicare Other

## 2013-01-15 LAB — LIPID PANEL
Cholesterol: 194 mg/dL (ref 0–200)
HDL: 52 mg/dL (ref >39)
Total CHOL/HDL Ratio: 3.7 RATIO
Triglycerides: 143 mg/dL (ref <150)
VLDL: 29 mg/dL (ref 0–40)

## 2013-01-15 LAB — GLUCOSE, CAPILLARY
Glucose-Capillary: 184 mg/dL — ABNORMAL HIGH (ref 70–99)
Glucose-Capillary: 157 mg/dL — ABNORMAL HIGH (ref 70–99)

## 2013-01-15 LAB — HEMOGLOBIN A1C: Mean Plasma Glucose: 134 mg/dL — ABNORMAL HIGH (ref <117)

## 2013-01-15 MED ORDER — IOHEXOL 350 MG/ML SOLN: 50.0000 mL | Freq: Once | INTRAVENOUS | Status: AC | PRN

## 2013-01-15 MED ORDER — ASPIRIN 325 MG PO TABS
325.0000 mg | ORAL_TABLET | Freq: Every day | ORAL | Status: DC
  Administered 2013-01-15: 325 mg via ORAL
  Filled 2013-01-15 (×2): qty 1

## 2013-01-15 MED ORDER — SIMVASTATIN 20 MG PO TABS
20.0000 mg | ORAL_TABLET | Freq: Every day | ORAL | Status: DC
  Filled 2013-01-15: qty 1

## 2013-01-15 MED ORDER — PANTOPRAZOLE SODIUM 40 MG PO TBEC
40.0000 mg | DELAYED_RELEASE_TABLET | Freq: Every day | ORAL | Status: DC
  Administered 2013-01-15 – 2013-01-16 (×2): 40 mg via ORAL
  Filled 2013-01-15 (×2): qty 1

## 2013-01-15 MED ORDER — ATORVASTATIN CALCIUM 10 MG PO TABS
10.0000 mg | ORAL_TABLET | Freq: Every day | ORAL | Status: DC
  Administered 2013-01-15: 10 mg via ORAL
  Filled 2013-01-15 (×2): qty 1

## 2013-01-15 NOTE — Progress Notes (Signed)
Patient is receiving Protonix by the IV route.  Pt meets the P & T approved criteria for changing to oral administration.  - No GI bleeding  - Tolerating an oral or per tube diet  - Taking other oral or per tube medications.  Will change patient to Protonix 40mg PO daily per P&T policy.  Thank you. Kennie Snedden, Pharm.D., BCPS Clinical Pharmacist Pager [phone]    

## 2013-01-15 NOTE — Evaluation (Signed)
Speech Language Pathology Evaluation Patient Details Name: [name] MRN: [id number] DOB: Jul 23, 1918 Today's Date: 01/15/2013 Time: 4098-1191 SLP Time Calculation (min): 32 min  Problem List:  Patient Active Problem List   Diagnosis Date Noted  . FRACTURE, HUMERUS, PROXIMAL 09/14/2009  . CLOSED FRACTURE UNSPECIFIED PART NECK FEMUR 09/14/2009   Past Medical History:  Past Medical History  Diagnosis Date  . Diabetes mellitus   . Hypertension    Past Surgical History:  Past Surgical History  Procedure Laterality Date  . Hip fracture surgery Bilateral    HPI:  [name] is a 79 y.o. female presenting with aphasia to [facility]. Transferred to [facility] status post IV t-PA 01/14/2013 at 1239. Suspect a left brain infarct. Initial imagine shows a old right occipital infarct. MRI indicates Patchy left posterior MCA territory acute infarct with mostly cortical involvement.    Assessment / Plan / Recommendation Clinical Impression  Patient presents with a mild receptive and a mild-moderate expressive aphasia effecting higher level communication. Patient will benefit from skilled SLP services to address above areas, improving communication for return to baseline functioning.     SLP Assessment  Patient needs continued Speech Lanaguage Pathology Services    Follow Up Recommendations  Outpatient SLP    Frequency and Duration min 2x/week  2 weeks   Pertinent Vitals/Pain None observed or reported   SLP Goals  SLP Goals Potential to Achieve Goals: Good Progress/Goals/Alternative treatment plan discussed with pt/caregiver and they: Agree SLP Goal #1: Patient will follow multi-step commands during a functional task with min assist.  SLP Goal #1 - Progress:  (new goal) SLP Goal #2: Patient will sustain attention to moderately complex functional ADL with min assist.  SLP Goal #2 - Progress:  (new goal) SLP Goal #3: Patient will complete sentence level reading comprehension task  related to functional ADL with min assist.  SLP Goal #3 - Progress:  (new goal) SLP Goal #4: Patient will utilize compensatory strategies to self correct anomic episodes at the conversation level with max cues.  SLP Goal #4 - Progress:  (new goal)  SLP Evaluation Prior Functioning  Cognitive/Linguistic Baseline: Within functional limits Type of Home: House Lives With: Alone Available Help at Discharge: Personal care attendant;Available PRN/intermittently;Family   Cognition  Overall Cognitive Status: Impaired/Different from baseline Arousal/Alertness: Awake/alert Orientation Level: Oriented X4 Attention: Sustained Sustained Attention: Impaired Sustained Attention Impairment: Verbal complex;Functional complex (mildly distracted) Memory: Appears intact (for short term recall of information) Awareness: Appears intact Problem Solving: Appears intact Safety/Judgment: Appears intact    Comprehension  Auditory Comprehension Overall Auditory Comprehension: Impaired Yes/No Questions: Within Functional Limits Commands: Impaired Complex Commands: 75-100% accurate Conversation: Simple EffectiveTechniques: Visual/Gestural cues;Repetition Visual Recognition/Discrimination Discrimination: Within Function Limits Reading Comprehension Reading Status: Impaired Word level: Within functional limits Sentence Level: Impaired Paragraph Level: Not tested Functional Environmental (signs, name badge): Within functional limits Interfering Components: Attention    Expression Expression Primary Mode of Expression: Verbal Verbal Expression Overall Verbal Expression: Impaired Initiation: No impairment Automatic Speech: Name;Social Response Level of Generative/Spontaneous Verbalization: Conversation Repetition: No impairment Naming: Impairment Responsive: 76-100% accurate Confrontation: Impaired Convergent: 50-74% accurate Divergent: 50-74% accurate Other Naming Comments: worsening impairement  noted at the conversation level Verbal Errors: Semantic paraphasias;Phonemic paraphasias;Aware of errors (inconsistently aware of errors) Pragmatics: No impairment Interfering Components: Attention Effective Techniques: Open ended questions;Phonemic cues Written Expression Dominant Hand: Right   Oral / Motor Oral Motor/Sensory Function Overall Oral Motor/Sensory Function: Appears within functional limits for tasks assessed Motor Speech  Overall Motor Speech: Appears within functional limits for tasks assessed   GO    Ferdinand Lango MA, CCC-SLP [phone]  Ferdinand Lango Meryl 01/15/2013, 3:11 PM

## 2013-01-15 NOTE — Progress Notes (Signed)
Rehab Admissions Coordinator Note:  Patient was screened by Clois Dupes for appropriateness for an Inpatient Acute Rehab Consult.  At this time, we are recommending Inpatient Rehab consult.  Clois Dupes 01/15/2013, 3:40 PM  I can be reached at [phone].

## 2013-01-15 NOTE — Progress Notes (Signed)
Stroke Team Progress Note  HISTORY [name] is a 79 y.o. female who was working a crossword at 9:40 am 01/14/2013 when she had sudden onset difficulty. Caregiver was with her at the time. Her symptoms were persistent without improvement until tPA was started 2:59 minutes after time of onset. Since that time, her son reports that she has had some improvement, indeed, between my initial exam and subsequent exam 15 minutes later, she had significant improvement. She was admitted to the neuro ICU for further evaluation and treatment.  SUBJECTIVE No family is at the bedside.  Overall she feels her condition is gradually improving. She verbalizes she does not like MRIs - she has had many in the past, they make her anxious. She began to tear up.  OBJECTIVE Most recent Vital Signs: Filed Vitals:   01/15/13 0600 01/15/13 0700 01/15/13 0800 01/15/13 0815  BP: 145/71 130/53 168/76   Pulse: 72 61 75   Temp:    98 F (36.7 C)  TempSrc:    Oral  Resp: 18 16 16    Height:      Weight:      SpO2: 100% 100% 99%    CBG (last 3)   Recent Labs  01/14/13 1550 01/14/13 2207 01/15/13 0814  GLUCAP 151* 197* 149*    IV Fluid Intake:   . sodium chloride 50 mL/hr at 01/15/13 0700    MEDICATIONS  . diltiazem  240 mg Oral Daily  . pantoprazole (PROTONIX) IV  40 mg Intravenous QHS   PRN:  acetaminophen, acetaminophen, labetalol, ondansetron (ZOFRAN) IV, senna-docusate  Diet:  Cardiac thin liquids Activity:  Bedrest DVT Prophylaxis:  SCDs   CLINICALLY SIGNIFICANT STUDIES Basic Metabolic Panel:   Recent Labs Lab 01/14/13 1112 01/14/13 1114  NA 139 141  K 4.8 4.5  CL 100 105  CO2 27  --   GLUCOSE 229* 215*  BUN 21 22  CREATININE 0.97 1.00  CALCIUM 10.1  --    Liver Function Tests:   Recent Labs Lab 01/14/13 1112  AST 21  ALT 9  ALKPHOS 108  BILITOT 0.3  PROT 8.3  ALBUMIN 4.4   CBC:   Recent Labs Lab 01/14/13 1112 01/14/13 1114  WBC 10.2  --   NEUTROABS 7.7  --   HGB  13.0 13.3  HCT 38.5 39.0  MCV 86.1  --   PLT 257  --    Coagulation:   Recent Labs Lab 01/14/13 1112  LABPROT 13.5  INR 1.04   Cardiac Enzymes:   Recent Labs Lab 01/14/13 1112  TROPONINI <0.30   Urinalysis:   Recent Labs Lab 01/14/13 1130  COLORURINE YELLOW  LABSPEC 1.010  PHURINE 6.0  GLUCOSEU 250*  HGBUR NEGATIVE  BILIRUBINUR NEGATIVE  KETONESUR NEGATIVE  PROTEINUR NEGATIVE  UROBILINOGEN 0.2  NITRITE NEGATIVE  LEUKOCYTESUR NEGATIVE   Lipid Panel    Component Value Date/Time   CHOL 194 01/15/2013 0355   TRIG 143 01/15/2013 0355   HDL 52 01/15/2013 0355   CHOLHDL 3.7 01/15/2013 0355   VLDL 29 01/15/2013 0355   LDLCALC 113* 01/15/2013 0355   HgbA1C  Lab Results  Component Value Date   HGBA1C  Value: 6.7 (NOTE)  According to the ADA Clinical Practice Recommendations for 2011, when HbA1c is used as a screening test:   >=6.5%   Diagnostic of Diabetes Mellitus           (if abnormal result  is confirmed)  5.7-6.4%   Increased risk of developing Diabetes Mellitus  References:Diagnosis and Classification of Diabetes Mellitus,Diabetes Care,2011,34(Suppl 1):S62-S69 and Standards of Medical Care in         Diabetes - 2011,Diabetes Care,2011,34  (Suppl 1):S11-S61.* 10/27/2010    Urine Drug Screen:     Component Value Date/Time   LABOPIA NONE DETECTED 01/14/2013 1130   COCAINSCRNUR NONE DETECTED 01/14/2013 1130   LABBENZ NONE DETECTED 01/14/2013 1130   AMPHETMU NONE DETECTED 01/14/2013 1130   THCU NONE DETECTED 01/14/2013 1130   LABBARB NONE DETECTED 01/14/2013 1130    Alcohol Level:   Recent Labs Lab 01/14/13 1112  ETH <11   CT of the brain   01/14/2013   1.  No acute intracranial findings.  No change since earlier study. 2.  Stable age related cerebral atrophy, ventriculomegaly and periventricular white matter disease.    01/14/2013    1.  No CT evidence of acute infarction. 2.  No intraventricular hemorrhage. 3.   Left frontal subdural hygroma is essentially stable 4. Microvascular disease and atrophy are unchanged.  5.   Remote infarction in the right paramedian occipital lobe.  MRI of the brain  Cancelled per patient request  MRA of the brain    2D Echocardiogram    Carotid Doppler    CXR  01/14/2013   No acute cardiopulmonary abnormality seen  EKG  atrial fibrillation, rate 84.   Therapy Recommendations   Physical Exam   Pleasant elderly caucasian lady not in distress.Awake alert. Afebrile. Head is nontraumatic. Neck is supple with left carotid bruit. Hearing is diminishedl. Cardiac exam no murmur or gallop. Lungs are clear to auscultation. Distal pulses are well felt. Neurological Exam :  Awake alert oriented x3. Nonfluent speech with mild expressive language difficulties and a few paraphasic errors and word hesitation. Good comprehension, naming and repetition. Follows two-step commands. Extraocular movements are full range without nystagmus. She blinks to threat bilaterally. Fundi were not visualized. Vision acuity seems adequate. Face is symmetric without weakness. Tongue is midline. Motor system exam reveals no upper upper or lower extremity drift. Symmetric and equal strength in all 4 extremities. No focal weakness. Touch and pinprick sensation are preserved. Reflexes are symmetric. Plantars are downgoing. Coordination is slow but accurate. Gait was not tested. ASSESSMENT [name] is a 79 y.o. female presenting with aphasia to [facility]. Transferred to [facility] status post IV t-PA 01/14/2013 at 1239. Suspect a left brain infarct. Initial imagine shows a old right occipital infarct. Infarcts felt to be embolic secondary to known atrial fibrillation.  On aspirin 81 mg orally every day prior to admission. Now on no antithrombotics as within 24h of tpa for secondary stroke prevention. Patient with resultant headache, expressive aphasia - word finding difficulties, anomia, 1/3 recall. Work up  underway.  atrial fibrillation Hypertension Diabetes, HgbA1c pending, goal < 7.0 Hyperlipidemia, LDL 113, on no statin PTA, now on no statin, goal LDL < 100 (< 70 for diabetics) family history of stroke  Hospital day # 1  TREATMENT/PLAN  Add aspirin 325 mg orally every day for secondary stroke prevention, first dose prior to midnight tonight, if post tpa imaging negative for hemorrhage. Will discuss anticoagulation tomorrow.  OOB. Therapy evals  Add statin  F/u 2D, carotid doppler, HgbA1c  Change MRI/A to limited MRI with CT angio of head to accommodate patient concerns about having MRI  Strict BP control and neuro monitoring per post TPA protocol  Annie Main, MSN, RN, ANVP-BC, ANP-BC, GNP-BC [facility] Pager: [phone] 01/15/2013 8:44 AM This patient is critically ill and at significant risk of neurological worsening, death and care requires constant monitoring of vital signs, hemodynamics,respiratory and cardiac monitoring,review of multiple databases, neurological assessment, discussion with family, other specialists and medical decision making of high complexity. I spent 30 minutes of neurocritical care time  in the care of  this patient. I have personally obtained a history, examined the patient, evaluated imaging results, and formulated the assessment and plan of care. I agree with the above. Delia Heady, MD

## 2013-01-15 NOTE — Evaluation (Signed)
Physical Therapy Evaluation Patient Details Name: LAQUISHA NORTHCRAFT MRN: [id number] DOB: 04/24/1918 Today's Date: 01/15/2013 Time: 1410-1450 PT Time Calculation (min): 40 min  PT Assessment / Plan / Recommendation Clinical Impression  Patient is a 79 y/o female admitted with acute onset difficulty with word puzzle.  She was given TPA on admission and symptoms improved.  She presents with decreased balance, decreased strength, decreased coordination and mild cognitive delays.  She will benefit from skilled PT in the acute setting to maximize independence to allow return home with intermittent assist.  Pt and family hopeful for inpatient rehab.    PT Assessment  Patient needs continued PT services    Follow Up Recommendations  CIR    Does the patient have the potential to tolerate intense rehabilitation    yes  Barriers to Discharge        Equipment Recommendations  None recommended by PT    Recommendations for Other Services Rehab consult   Frequency Min 4X/week    Precautions / Restrictions Precautions Precautions: Fall   Pertinent Vitals/Pain No pain complaints      Mobility  Bed Mobility Bed Mobility: Supine to Sit;Sitting - Scoot to Edge of Bed Supine to Sit: HOB elevated;4: Min guard Sitting - Scoot to [place] of Bed: 4: Min guard Transfers Transfers: Sit to Stand;Stand to Sit Sit to Stand: 4: Min assist;From bed;With upper extremity assist Stand to Sit: 4: Min assist Ambulation/Gait Ambulation/Gait Assistance: 4: Min assist Ambulation Distance (Feet): 80 Feet Assistive device: Rolling walker Ambulation/Gait Assistance Details: increased time with turns and assist for walker managment Gait Pattern: Step-through pattern;Wide base of support;Shuffle Modified Rankin (Stroke Patients Only) Pre-Morbid Rankin Score: Moderate disability Modified Rankin: Moderately severe disability        PT Diagnosis: Abnormality of gait  PT Problem List: Decreased strength;Decreased  cognition;Decreased balance;Decreased mobility;Decreased coordination PT Treatment Interventions: Gait training;Balance training;Neuromuscular re-education;Functional mobility training;Patient/family education;Therapeutic activities;Therapeutic exercise;Stair training;Cognitive remediation   PT Goals Acute Rehab PT Goals PT Goal Formulation: With patient/family Time For Goal Achievement: 01/29/13 Potential to Achieve Goals: Good Pt will go Supine/Side to Sit: with modified independence PT Goal: Supine/Side to Sit - Progress: Goal set today Pt will go Sit to Stand: with modified independence PT Goal: Sit to Stand - Progress: Goal set today Pt will Stand: with modified independence;3 - 5 min;with unilateral upper extremity support PT Goal: Stand - Progress: Goal set today Pt will Ambulate: >150 feet;with modified independence;with rolling walker PT Goal: Ambulate - Progress: Goal set today Pt will Go Up / Down Stairs: 1-2 stairs;with modified independence PT Goal: Up/Down Stairs - Progress: Goal set today  Visit Information  Last PT Received On: 01/15/13 Assistance Needed: +1    Subjective Data  Subjective: I'm so glad I can walk. Patient Stated Goal: To return home after rehab   Prior Functioning  Home Living Lives With: Alone Available Help at Discharge: Personal care attendant;Available PRN/intermittently;Family Type of Home: House Home Access: Stairs to enter [employer] of Steps: 1 Entrance Stairs-Rails: None Home Layout: One level Bathroom Shower/Tub: Engineer, manufacturing systems: Handicapped height Home Adaptive Equipment: Walker - rolling;Tub transfer bench;Grab bars around toilet Additional Comments: Caregiver 5 hours a day 5 days a week and son checks at night and on weekends Prior Function Level of Independence: Needs assistance Needs Assistance: Bathing;Light Housekeeping;Meal Prep Driving: No Communication Communication: Expressive  difficulties Dominant Hand: Right    Cognition  Cognition Arousal/Alertness: Awake/alert Behavior During Therapy: WFL for tasks assessed/performed Overall Cognitive Status:  Impaired/Different from baseline    Extremity/Trunk Assessment Right Upper Extremity Assessment RUE Coordination: Deficits RUE Coordination Deficits: slowed coordination right worse than left with finger to nose and thumb to each finger Left Upper Extremity Assessment LUE Coordination: Deficits LUE Coordination Deficits: slowed coordination right worse than left with finger to nose and thumb to each finger Right Lower Extremity Assessment RLE ROM/Strength/Tone: [facility] for tasks assessed RLE Coordination: Deficits RLE Coordination Deficits: slowed coordination with toe tapping Left Lower Extremity Assessment LLE ROM/Strength/Tone: WFL for tasks assessed LLE Coordination: Deficits LLE Coordination Deficits: slowed coordination with toe tapping Trunk Assessment Trunk Assessment: Kyphotic   Balance Balance Balance Assessed: Yes Static Sitting Balance Static Sitting - Balance Support: Feet supported Static Sitting - Level of Assistance: 5: Stand by assistance  End of Session PT - End of Session Equipment Utilized During Treatment: Gait belt Activity Tolerance: Patient tolerated treatment well Patient left: in chair;with family/visitor present;with call bell/phone within reach  GP     [facility] 01/15/2013, 3:13 PM  [place], [place] [phone] 01/15/2013

## 2013-01-15 NOTE — Progress Notes (Signed)
VASCULAR LAB PRELIMINARY  PRELIMINARY  PRELIMINARY  PRELIMINARY  Carotid duplex  completed.    Preliminary report:  Bilateral:  No evidence of hemodynamically significant internal carotid artery stenosis.   Vertebral artery flow is antegrade.      [name], RVT 01/15/2013, 11:08 AM

## 2013-01-16 DIAGNOSIS — S42209A Unspecified fracture of upper end of unspecified humerus, initial encounter for closed fracture: Secondary | ICD-10-CM

## 2013-01-16 DIAGNOSIS — I6330 Cerebral infarction due to thrombosis of unspecified cerebral artery: Secondary | ICD-10-CM

## 2013-01-16 DIAGNOSIS — S72009A Fracture of unspecified part of neck of unspecified femur, initial encounter for closed fracture: Secondary | ICD-10-CM

## 2013-01-16 LAB — GLUCOSE, CAPILLARY: Glucose-Capillary: 140 mg/dL — ABNORMAL HIGH (ref 70–99)

## 2013-01-16 MED ORDER — APIXABAN 2.5 MG PO TABS: 2.5000 mg | ORAL_TABLET | Freq: Two times a day (BID) | ORAL | Status: DC

## 2013-01-16 MED ORDER — ATORVASTATIN CALCIUM 10 MG PO TABS: 10.0000 mg | ORAL_TABLET | Freq: Every day | ORAL | Status: DC

## 2013-01-16 MED ORDER — APIXABAN 2.5 MG PO TABS
2.5000 mg | ORAL_TABLET | Freq: Two times a day (BID) | ORAL | Status: DC
  Administered 2013-01-16: 2.5 mg via ORAL
  Filled 2013-01-16 (×2): qty 1

## 2013-01-16 NOTE — Consult Note (Signed)
Physical Medicine and Rehabilitation Consult Reason for Consult: CVA Referring Physician: Dr. Pearlean Brownie   HPI: [name] is a 79 y.o. right handed female with a history of hypertension as well as diabetes mellitus with peripheral neuropathy and multiple back surgeries. Patient independent with walker prior to admission and has a caregiver 5 hours daily with followup care by family. Admitted 01/14/2013 with altered mental status. MRI of the brain showed patchy left posterior MCA territory acute infarction with mostly cortical involvement as well as chronic right PCA infarct. MRA of the head negative. Patient did receive TPA. Carotid Dopplers with no ICA stenosis. Echocardiogram with ejection fraction of 70% grade 2 diastolic dysfunction. Neurology service followup maintain on aspirin therapy. Speech therapy evaluation noting mild receptive and mild to moderate expressive aphasia. Patient is maintained on a regular consistency diet. Physical therapy evaluation completed 01/15/2013 with recommendations of physical medicine rehabilitation consult to consider inpatient rehabilitation services.  Review of Systems  Cardiovascular: Positive for palpitations.  Gastrointestinal: Positive for constipation.  Musculoskeletal: Positive for myalgias and back pain.  Psychiatric/Behavioral: Positive for depression.  All other systems reviewed and are negative.   Past Medical History  Diagnosis Date  . Diabetes mellitus   . Hypertension    Past Surgical History  Procedure Laterality Date  . Hip fracture surgery Bilateral    No family history on file. Social History:  reports that she has never smoked. She does not have any smokeless tobacco history on file. She reports that she does not drink alcohol. Her drug history is not on file. Allergies:  Allergies  Allergen Reactions  . Codeine    Medications Prior to Admission  Medication Sig Dispense Refill  . aspirin EC 81 MG tablet Take 81 mg by mouth  daily.      Marland Kitchen diltiazem (DILACOR XR) 240 MG 24 hr capsule Take 240 mg by mouth daily.      . metFORMIN (GLUCOPHAGE-XR) 500 MG 24 hr tablet Take 500 mg by mouth daily with breakfast. Take with largest meal.      . sertraline (ZOLOFT) 100 MG tablet Take 100 mg by mouth daily.      Marland Kitchen zolpidem (AMBIEN) 5 MG tablet Take 1 tablet (5 mg total) by mouth at bedtime as needed for sleep.  30 tablet  5    Home: Home Living Lives With: Alone Available Help at Discharge: Personal care attendant;Available PRN/intermittently;Family Type of Home: House Home Access: Stairs to enter [employer] of Steps: 1 Entrance Stairs-Rails: None Home Layout: One level Bathroom Shower/Tub: Engineer, manufacturing systems: Handicapped height Home Adaptive Equipment: Walker - rolling;Tub transfer bench;Grab bars around toilet Additional Comments: Caregiver 5 hours a day 5 days a week and son checks at night and on weekends  Functional History: Prior Function Driving: No Functional Status:  Mobility: Bed Mobility Bed Mobility: Supine to Sit;Sitting - Scoot to Edge of Bed Supine to Sit: HOB elevated;4: Min guard Sitting - Scoot to [place] of Bed: 4: Min guard Transfers Transfers: Sit to Stand;Stand to Sit Sit to Stand: 4: Min assist;From bed;With upper extremity assist Stand to Sit: 4: Min assist Ambulation/Gait Ambulation/Gait Assistance: 4: Min assist Ambulation Distance (Feet): 80 Feet Assistive device: Rolling walker Ambulation/Gait Assistance Details: increased time with turns and assist for walker managment Gait Pattern: Step-through pattern;Wide base of support;Shuffle    ADL:    Cognition: Cognition Overall Cognitive Status: Impaired/Different from baseline Arousal/Alertness: Awake/alert Orientation Level: Oriented to person;Oriented to place;Oriented to time;Disoriented to situation Attention: Sustained  Sustained Attention: Impaired Sustained Attention Impairment: Verbal  complex;Functional complex (mildly distracted) Memory: Appears intact (for short term recall of information) Awareness: Appears intact Problem Solving: Appears intact Safety/Judgment: Appears intact Cognition Arousal/Alertness: Awake/alert Behavior During Therapy: WFL for tasks assessed/performed Overall Cognitive Status: Impaired/Different from baseline  Blood pressure 151/65, pulse 63, temperature 98.1 F (36.7 C), temperature source Oral, resp. rate 17, height 5\' 4"  (1.626 m), weight 52.5 kg (115 lb 11.9 oz), SpO2 95.00%. Physical Exam  Vitals reviewed. Constitutional: She is oriented to person, place, and time.  HENT:  Head: Normocephalic.  Eyes: EOM are normal.  Neck: Normal range of motion. Neck supple. No thyromegaly present.  Cardiovascular: Normal rate and regular rhythm.   Pulmonary/Chest: Effort normal and breath sounds normal. No respiratory distress.  Abdominal: Soft. Bowel sounds are normal. She exhibits no distension.  Neurological: She is alert and oriented to person, place, and time.  Patient was pleasant and fully cooperative with exam. Speech was clear and fully oriented the basic conversation. She did follow 3 step commands. Minimal right sided weakness and minimal decrease in [facility]. Wide based gait. Didn't favor either side with gait or with manipulation of the walker. ?mild right inattention  Skin: Skin is warm and dry.  Psychiatric: She has a normal mood and affect. Her behavior is normal. Thought content normal.    Results for orders placed during the hospital encounter of 01/14/13 (from the past 24 hour(s))  GLUCOSE, CAPILLARY     Status: Abnormal   Collection Time    01/15/13  8:14 AM      Result Value Range   Glucose-Capillary 149 (*) 70 - 99 mg/dL  GLUCOSE, CAPILLARY     Status: Abnormal   Collection Time    01/15/13 11:58 AM      Result Value Range   Glucose-Capillary 184 (*) 70 - 99 mg/dL  GLUCOSE, CAPILLARY     Status: Abnormal   Collection Time     01/15/13  6:01 PM      Result Value Range   Glucose-Capillary 173 (*) 70 - 99 mg/dL   Comment 1 Notify RN     Comment 2 Documented in Chart    GLUCOSE, CAPILLARY     Status: Abnormal   Collection Time    01/15/13 10:14 PM      Result Value Range   Glucose-Capillary 157 (*) 70 - 99 mg/dL   Comment 1 Notify RN     Comment 2 Documented in Chart     Ct Head Wo Contrast  01/14/2013   *RADIOLOGY REPORT*  Clinical Data: Confusion and slurred speech.  CT HEAD WITHOUT CONTRAST  Technique:  Contiguous axial images were obtained from the base of the skull through the vertex without contrast.  Comparison: 10/26/2012.  Earlier studies same date.  Findings: Stable head CT findings.  No new/acute intracranial process is identified.  Stable white matter disease, atrophy and ventriculomegaly.  Remote right occipital infarct is again demonstrated.  IMPRESSION:  1.  No acute intracranial findings.  No change since earlier study. 2.  Stable age related cerebral atrophy, ventriculomegaly and periventricular white matter disease.   Original Report Authenticated By: Rudie Meyer, M.D.   Ct Head Wo Contrast  01/14/2013   *RADIOLOGY REPORT*  Clinical Data: Slurred speech.  CT HEAD WITHOUT CONTRAST  Technique:  Contiguous axial images were obtained from the base of the skull through the vertex without contrast.  Comparison: PET CT 10/27/2010  Findings: No acute intracranial hemorrhage.  No focal  mass lesion. No CT evidence of acute infarction.   No midline shift or mass effect.  No hydrocephalus.  Basilar cisterns are patent.   The previous described low density subdural hygroma in the left frontal area is not significantly changed.  There persists a subtle rightward midline shift unchanged.  Potential remote infarction in the right paramedian occipital lobe is also unchanged.  IMPRESSION:  1.  No CT evidence of acute infarction. 2.  No intraventricular hemorrhage. 3.  Left frontal subdural hygroma is essentially stable 4.  Microvascular disease and atrophy are unchanged.  5.   Remote infarction in the right paramedian occipital lobe.  Findings assist Dr. Rosalia Hammers on 01/14/2013  at 11:35   Original Report Authenticated By: Genevive Bi, M.D.   Mr [facility] Wo Contrast  01/15/2013   *RADIOLOGY REPORT*  Clinical Data:  79 year old female who presented yesterday with acute onset of difficulty speaking.  Status post t-PA.  Comparison: Head CTs 01/14/2013 and earlier.  MRI HEAD WITHOUT CONTRAST  Technique: Multiplanar, multiecho pulse sequences of the brain and surrounding structures were obtained according to standard protocol without intravenous contrast.  Findings: There is patchy cortical and subcortical white matter (the latter primarily in the left occipital lobe) restricted diffusion in the posterior left MCA territory affecting the posterior insula, left superior temporal gyrus, and the antral lateral left occipital lobe.  No associated hemorrhage.  No deep gray matter nuclei involvement.  No definite contralateral or posterior fossa involvement. Major intracranial vascular flow voids are preserved.  MRA findings are below.  Chronic right PCA territory infarct primarily affecting the occipital pole.  No midline shift, mass effect, or evidence of mass lesion.  No ventriculomegaly.  Patchy confluent bilateral scattered cerebral white matter T2 and FLAIR hyperintensities superimposed on a acute findings.  Negative pituitary, cervicomedullary junction. Negative for age visualized cervical spine.  Normal bone marrow signal.  Visualized paranasal sinuses and mastoids are clear. No acute orbit soft tissue findings.  Negative scalp soft tissues.  IMPRESSION: 1.  Patchy left posterior MCA territory acute infarct with mostly cortical involvement as detailed above.  No associated hemorrhage or mass effect. 2.  Chronic right PCA infarct. 3.  MRA findings are below.  MRA HEAD WITHOUT CONTRAST  Technique: Angiographic images of the Circle of Willis  were obtained using MRA technique without  intravenous contrast.  Findings: Antegrade flow in the posterior circulation.  Mildly dominant distal left vertebral artery.  Normal left PICA.  Right PICA may be diminutive.  Patent vertebrobasilar junction.  No basilar artery stenosis.  SCA and right PCA origins are normal. Fetal type left PCA origin.  Bilateral PCA branches are within normal limits.  Antegrade flow in both ICA siphons.  No ICA stenosis.  Ophthalmic artery origins and posterior communicating artery origins are within normal limits.  A diminutive right posterior communicating artery is suspected.  Both carotid termini are patent.  Bilateral MCA and ACA origins are within normal limits.  Anterior communicating artery appears fenestrated but otherwise within normal limits.  Mild motion degradation of the bilateral visualized ACA branches which are within normal limits.  "Duplicated" right MCA M1 segment (right MCA bifurcation directly off of the terminus).  Right MCA branches are within normal limits (also mildly motion degraded).  Left MCA origin and M1 segment are within normal limits.  The left MCA bifurcation is patent.  No left MCA M2 branch occlusion or stenosis is identified.  IMPRESSION: Essentially negative intracranial MRA. No left MCA stenosis or major  branch occlusion.   Original Report Authenticated By: Erskine Speed, M.D.   Mr Brain Wo Contrast  01/15/2013   *RADIOLOGY REPORT*  Clinical Data:  79 year old female who presented yesterday with acute onset of difficulty speaking.  Status post t-PA.  Comparison: Head CTs 01/14/2013 and earlier.  MRI HEAD WITHOUT CONTRAST  Technique: Multiplanar, multiecho pulse sequences of the brain and surrounding structures were obtained according to standard protocol without intravenous contrast.  Findings: There is patchy cortical and subcortical white matter (the latter primarily in the left occipital lobe) restricted diffusion in the posterior left MCA  territory affecting the posterior insula, left superior temporal gyrus, and the antral lateral left occipital lobe.  No associated hemorrhage.  No deep gray matter nuclei involvement.  No definite contralateral or posterior fossa involvement. Major intracranial vascular flow voids are preserved.  MRA findings are below.  Chronic right PCA territory infarct primarily affecting the occipital pole.  No midline shift, mass effect, or evidence of mass lesion.  No ventriculomegaly.  Patchy confluent bilateral scattered cerebral white matter T2 and FLAIR hyperintensities superimposed on a acute findings.  Negative pituitary, cervicomedullary junction. Negative for age visualized cervical spine.  Normal bone marrow signal.  Visualized paranasal sinuses and mastoids are clear. No acute orbit soft tissue findings.  Negative scalp soft tissues.  IMPRESSION: 1.  Patchy left posterior MCA territory acute infarct with mostly cortical involvement as detailed above.  No associated hemorrhage or mass effect. 2.  Chronic right PCA infarct. 3.  MRA findings are below.  MRA HEAD WITHOUT CONTRAST  Technique: Angiographic images of the Circle of Willis were obtained using MRA technique without  intravenous contrast.  Findings: Antegrade flow in the posterior circulation.  Mildly dominant distal left vertebral artery.  Normal left PICA.  Right PICA may be diminutive.  Patent vertebrobasilar junction.  No basilar artery stenosis.  SCA and right PCA origins are normal. Fetal type left PCA origin.  Bilateral PCA branches are within normal limits.  Antegrade flow in both ICA siphons.  No ICA stenosis.  Ophthalmic artery origins and posterior communicating artery origins are within normal limits.  A diminutive right posterior communicating artery is suspected.  Both carotid termini are patent.  Bilateral MCA and ACA origins are within normal limits.  Anterior communicating artery appears fenestrated but otherwise within normal limits.  Mild  motion degradation of the bilateral visualized ACA branches which are within normal limits.  "Duplicated" right MCA M1 segment (right MCA bifurcation directly off of the terminus).  Right MCA branches are within normal limits (also mildly motion degraded).  Left MCA origin and M1 segment are within normal limits.  The left MCA bifurcation is patent.  No left MCA M2 branch occlusion or stenosis is identified.  IMPRESSION: Essentially negative intracranial MRA. No left MCA stenosis or major branch occlusion.   Original Report Authenticated By: Erskine Speed, M.D.   Dg Chest Port 1 View  01/14/2013   *RADIOLOGY REPORT*  Clinical Data: Weakness, hypertension  PORTABLE CHEST - 1 VIEW  Comparison: August 22, 2012.  Findings: Stable cardiomediastinal silhouette.  No acute pulmonary disease is noted.  No pleural effusion or pneumothorax is noted. Status post kyphoplasty of multiple levels in the thoracic and lumbar spine.  IMPRESSION: No acute cardiopulmonary abnormality seen.   Original Report Authenticated By: Lupita Raider.,  M.D.    Assessment/Plan: Diagnosis: right MCA infarct 1. Does the need for close, 24 hr/day medical supervision in concert with the patient's rehab needs  make it unreasonable for this patient to be served in a less intensive setting? No 2. Co-Morbidities requiring supervision/potential complications: see above 3. Due to bladder management, bowel management, safety and disease management, does the patient require 24 hr/day rehab nursing? No 4. Does the patient require coordinated care of a physician, rehab nurse, PT and OT to address physical and functional deficits in the context of the above medical diagnosis(es)? No Addressing deficits in the following areas: balance, strength, bowel/bladder control, bathing and dressing 5. Can the patient actively participate in an intensive therapy program of at least 3 hrs of therapy per day at least 5 days per week? Potentially 6. The potential for  patient to make measurable gains while on inpatient rehab is fair 7. Anticipated functional outcomes upon discharge from inpatient rehab are n/a with PT, n/a with OT, n/a with SLP. 8. Estimated rehab length of stay to reach the above functional goals is: n/a 9. Does the patient have adequate social supports to accommodate these discharge functional goals? Yes 10. Anticipated D/C setting: Home 11. Anticipated post D/C treatments: HH therapy 12. Overall Rehab/Functional Prognosis: excellent  RECOMMENDATIONS: This patient's condition is appropriate for continued rehabilitative care in the following setting: HHPT Patient has agreed to participate in recommended program. Yes Note that insurance prior authorization may be required for reimbursement for recommended care.  Comment: Pt had a PCA 5 days per week who helped her dress, bathe, etc during day. Son came over at night. She doesn't appear too far from baseline. I would recommend speaking family, PCA regarding needs. Probably can go home with HHPT.           Ranelle Oyster, MD, [facility] Physical Medicine & Rehabilitation     01/16/2013

## 2013-01-16 NOTE — Evaluation (Signed)
Occupational Therapy Evaluation Patient Details Name: [name] MRN: [id number] DOB: Mar 19, 1918 Today's Date: 01/16/2013 Time: 4098-1191 OT Time Calculation (min): 12 min  OT Assessment / Plan / Recommendation Clinical Impression  Pt admitted with acute onset difficulty with word puzzle.  She was given TPA on admission and symptoms improved. Feel that pt is near her baseline but will need family/caregiver to confirm this.  Pt will benefit from continued OT services to address below problem list in prep for return home.    OT Assessment  Patient needs continued OT Services    Follow Up Recommendations  Home health OT;Supervision/Assistance - 24 hour    Barriers to Discharge      Equipment Recommendations  None recommended by OT    Recommendations for Other Services    Frequency  Min 2X/week    Precautions / Restrictions Precautions Precautions: Fall   Pertinent Vitals/Pain See vitals    ADL  Eating/Feeding: Performed;Modified independent Where Assessed - Eating/Feeding: Chair Grooming: Performed;Wash/dry hands;Set up Where Assessed - Grooming: Supported standing Upper Body Bathing: Simulated;Set up Where Assessed - Upper Body Bathing: Unsupported sitting Lower Body Bathing: Simulated;Min guard Where Assessed - Lower Body Bathing: Supported sit to stand Upper Body Dressing: Simulated;Set up Where Assessed - Upper Body Dressing: Unsupported sitting Lower Body Dressing: Performed;Min guard (donned undergarment) Where Assessed - Lower Body Dressing: Supported sit to stand Toilet Transfer: Performed;Min Pension scheme manager Method:  (ambulating) Acupuncturist: Materials engineer and Hygiene: Performed;Min guard Where Assessed - Engineer, mining and Hygiene: Sit to stand from 3-in-1 or toilet Equipment Used: Gait belt;Rolling walker Transfers/Ambulation Related to ADLs: min guard with RW ADL Comments: Pt seems  near baseline (will need family/caregiver to confirm).  Slightly unsteady during standing components of ADLs and needs VCs for safely maneuvering around lines/leads.    OT Diagnosis: Generalized weakness  OT Problem List: Decreased strength;Decreased activity tolerance;Impaired balance (sitting and/or standing);Decreased knowledge of use of DME or AE OT Treatment Interventions: Self-care/ADL training;DME and/or AE instruction;Therapeutic activities;Patient/family education;Balance training   OT Goals Acute Rehab OT Goals OT Goal Formulation: With patient Time For Goal Achievement: 01/23/13 Potential to Achieve Goals: Good ADL Goals Pt Will Perform Grooming: with supervision;Standing at sink ADL Goal: Grooming - Progress: Goal set today Pt Will Perform Lower Body Bathing: with supervision;Sit to stand from chair;Sit to stand from bed ADL Goal: Lower Body Bathing - Progress: Goal set today Pt Will Perform Lower Body Dressing: with supervision;Sit to stand from chair;Sit to stand from bed ADL Goal: Lower Body Dressing - Progress: Goal set today Pt Will Transfer to Toilet: with supervision;Ambulation;with DME;Regular height toilet;Comfort height toilet ADL Goal: Toilet Transfer - Progress: Goal set today Pt Will Perform Toileting - Clothing Manipulation: with supervision;Standing ADL Goal: Toileting - Clothing Manipulation - Progress: Goal set today Pt Will Perform Toileting - Hygiene: with supervision;Sit to stand from 3-in-1/toilet ADL Goal: Toileting - Hygiene - Progress: Goal set today  Visit Information  Last OT Received On: 01/16/13 Assistance Needed: +1    Subjective Data      Prior Functioning     Home Living Lives With: Alone Available Help at Discharge: Personal care attendant;Available PRN/intermittently;Family Type of Home: House Home Access: Stairs to enter [employer] of Steps: 1 Entrance Stairs-Rails: None Home Layout: One level Bathroom Shower/Tub:  Engineer, manufacturing systems: Handicapped height Home Adaptive Equipment: Walker - rolling;Tub transfer bench;Grab bars around toilet Additional Comments: Caregiver 5 hours a day 5 days a  week and son checks at night and on weekends Prior Function Level of Independence: Needs assistance Needs Assistance: Bathing;Light Housekeeping;Meal Prep Communication Communication: Expressive difficulties Dominant Hand: Right         Vision/Perception Vision - History Patient Visual Report: No change from baseline   Cognition  Cognition Arousal/Alertness: Awake/alert Behavior During Therapy: WFL for tasks assessed/performed    Extremity/Trunk Assessment Right Upper Extremity Assessment RUE ROM/Strength/Tone: WFL for tasks assessed RUE Sensation: WFL - Light Touch;WFL - Proprioception RUE Coordination: Deficits RUE Coordination Deficits: increased time for gross motor tasks Left Upper Extremity Assessment LUE ROM/Strength/Tone: WFL for tasks assessed LUE Sensation: WFL - Light Touch;WFL - Proprioception LUE Coordination: Deficits LUE Coordination Deficits: increased time for gross motor tasks     Mobility Bed Mobility Bed Mobility: Not assessed  Transfers Transfers: Sit to Stand;Stand to Sit Sit to Stand: 4: Min guard;From chair/3-in-1;With armrests;With upper extremity assist Stand to Sit: 4: Min guard;To chair/3-in-1;With armrests;With upper extremity assist Details for Transfer Assistance: Min guard for steadying. VCs to control descent to chair/3n1.     Exercise     Balance Balance Balance Assessed: Yes Dynamic Standing Balance Dynamic Standing - Balance Support: During functional activity;No upper extremity supported Dynamic Standing - Level of Assistance: 5: Stand by assistance;4: Min assist Dynamic Standing - Balance Activities: Forward lean/weight shifting;Lateral lean/weight shifting;Reaching for objects Dynamic Standing - Comments: Close guarding for safety while  standing for toileting tasks and to pull undergarment up over hip. Slightly unsteady but only needs guarding to correct.   End of Session OT - End of Session Equipment Utilized During Treatment: Gait belt Activity Tolerance: Patient tolerated treatment well Patient left: in chair;with call bell/phone within reach Nurse Communication: Mobility status  GO   01/16/2013 Cipriano Mile OTR/L Pager [phone] Office [phone]   Cipriano Mile 01/16/2013, 10:56 AM

## 2013-01-16 NOTE — Progress Notes (Signed)
Physical Therapy Treatment Patient Details Name: [name] MRN: [id number] DOB: 12/14/1917 Today's Date: 01/16/2013 Time: 4098-1191 PT Time Calculation (min): 10 min  PT Assessment / Plan / Recommendation Comments on Treatment Session  79 y.o. female admitted to [facility] with acute onset difficulty with word puzzle.  She is s/p tPA.  MRI revealed L posterior MCA acute infarct.  Pt presents today with better mobility.  She reports she has a care taker 9-4 and then son comes in in the evening to get her dinner.  She seems to be getting pretty close to her normal baseline.  We will need son and/or care giver to see her to confirm.      Follow Up Recommendations  Home health PT;Supervision/Assistance - 24 hour     Does the patient have the potential to tolerate intense rehabilitation    yes  Barriers to Discharge   none      Equipment Recommendations  None recommended by PT    Recommendations for Other Services   none  Frequency Min 4X/week   Plan Discharge plan needs to be updated;Frequency remains appropriate    Precautions / Restrictions Precautions Precautions: Fall   Pertinent Vitals/Pain See vitals flow sheet.     Mobility  Bed Mobility Supine to Sit: With rails;HOB flat;4: Min assist Sitting - Scoot to Edge of Bed: 5: Supervision Details for Bed Mobility Assistance: min hand held assist to pull up to sitting.   Transfers Transfers: Sit to Stand;Stand to Sit Sit to Stand: 4: Min guard;From elevated surface;From bed Details for Transfer Assistance: min guard assist to steady pt for balance.   Ambulation/Gait Ambulation/Gait Assistance: 4: Min assist Ambulation Distance (Feet): 120 Feet Assistive device: Rolling walker Ambulation/Gait Assistance Details: pt needed min guard assist to stabilize trunk for balance, help attend to obstacles on her right side and at times help steer RW while turning corners.   Gait Pattern: Step-through pattern;Trunk flexed;Shuffle      PT  Goals Acute Rehab PT Goals PT Goal: Supine/Side to Sit - Progress: Progressing toward goal PT Goal: Sit to Stand - Progress: Progressing toward goal PT Goal: Ambulate - Progress: Progressing toward goal  Visit Information  Last PT Received On: 01/16/13 Assistance Needed: +1    Subjective Data  Subjective: Pt reports she didn't know her breakfast was here yet.  Just woke up Patient Stated Goal: To go home   Cognition  Cognition Arousal/Alertness: Awake/alert Behavior During Therapy: WFL for tasks assessed/performed    Balance  Static Sitting Balance Static Sitting - Balance Support: Feet supported;Bilateral upper extremity supported Static Sitting - Level of Assistance: 5: Stand by assistance  End of Session PT - End of Session Activity Tolerance: Patient tolerated treatment well Patient left: Other (comment) (with OT at doorway.  OT to take over treatment now) Nurse Communication: Other (comment) (RN assisting with tx.  )     Rollene Rotunda. Malana Eberwein, PT, DPT [phone]   01/16/2013, 8:49 AM

## 2013-01-16 NOTE — Progress Notes (Addendum)
Stroke Team Progress Note  HISTORY [name] is a 79 y.o. female who was working a crossword at 9:40 am 01/14/2013 when she had sudden onset difficulty. Caregiver was with her at the time. Her symptoms were persistent without improvement until tPA was started 2:59 minutes after time of onset. Since that time, her son reports that she has had some improvement, indeed, between my initial exam and subsequent exam 15 minutes later, she had significant improvement. She was admitted to the neuro ICU for further evaluation and treatment.  SUBJECTIVE Patient up in hall, walking with therapy. She has a caregiver at home. Her son is on the way per her (phone call attempts:  is not at work, does not answer home phone).  OBJECTIVE Most recent Vital Signs: Filed Vitals:   01/16/13 0400 01/16/13 0419 01/16/13 0500 01/16/13 0700  BP: 140/74  151/65 108/96  Pulse: 75  63 95  Temp:  98.1 F (36.7 C)    TempSrc:  Oral    Resp: 16  17 20   Height:      Weight:      SpO2: 95%  95% 97%   CBG (last 3)   Recent Labs  01/15/13 1801 01/15/13 2214 01/16/13 0747  GLUCAP 173* 157* 140*    IV Fluid Intake:   . sodium chloride 50 mL/hr at 01/15/13 1222    MEDICATIONS  . aspirin  325 mg Oral Daily  . atorvastatin  10 mg Oral q1800  . diltiazem  240 mg Oral Daily  . pantoprazole  40 mg Oral Daily   PRN:  acetaminophen, acetaminophen, labetalol, ondansetron (ZOFRAN) IV, senna-docusate  Diet:  Cardiac thin liquids Activity:  OOB with assistance DVT Prophylaxis:  SCDs   CLINICALLY SIGNIFICANT STUDIES Basic Metabolic Panel:   Recent Labs Lab 01/14/13 1112 01/14/13 1114  NA 139 141  K 4.8 4.5  CL 100 105  CO2 27  --   GLUCOSE 229* 215*  BUN 21 22  CREATININE 0.97 1.00  CALCIUM 10.1  --    Liver Function Tests:   Recent Labs Lab 01/14/13 1112  AST 21  ALT 9  ALKPHOS 108  BILITOT 0.3  PROT 8.3  ALBUMIN 4.4   CBC:   Recent Labs Lab 01/14/13 1112 01/14/13 1114  WBC 10.2  --    NEUTROABS 7.7  --   HGB 13.0 13.3  HCT 38.5 39.0  MCV 86.1  --   PLT 257  --    Coagulation:   Recent Labs Lab 01/14/13 1112  LABPROT 13.5  INR 1.04   Cardiac Enzymes:   Recent Labs Lab 01/14/13 1112  TROPONINI <0.30   Urinalysis:   Recent Labs Lab 01/14/13 1130  COLORURINE YELLOW  LABSPEC 1.010  PHURINE 6.0  GLUCOSEU 250*  HGBUR NEGATIVE  BILIRUBINUR NEGATIVE  KETONESUR NEGATIVE  PROTEINUR NEGATIVE  UROBILINOGEN 0.2  NITRITE NEGATIVE  LEUKOCYTESUR NEGATIVE   Lipid Panel    Component Value Date/Time   CHOL 194 01/15/2013 0355   TRIG 143 01/15/2013 0355   HDL 52 01/15/2013 0355   CHOLHDL 3.7 01/15/2013 0355   VLDL 29 01/15/2013 0355   LDLCALC 113* 01/15/2013 0355   HgbA1C  Lab Results  Component Value Date   HGBA1C 6.3* 01/15/2013    Urine Drug Screen:     Component Value Date/Time   LABOPIA NONE DETECTED 01/14/2013 1130   COCAINSCRNUR NONE DETECTED 01/14/2013 1130   LABBENZ NONE DETECTED 01/14/2013 1130   AMPHETMU NONE DETECTED 01/14/2013 1130  THCU NONE DETECTED 01/14/2013 1130   LABBARB NONE DETECTED 01/14/2013 1130    Alcohol Level:   Recent Labs Lab 01/14/13 1112  ETH <11   CT of the brain   01/14/2013   1.  No acute intracranial findings.  No change since earlier study. 2.  Stable age related cerebral atrophy, ventriculomegaly and periventricular white matter disease.    01/14/2013    1.  No CT evidence of acute infarction. 2.  No intraventricular hemorrhage. 3.  Left frontal subdural hygroma is essentially stable 4. Microvascular disease and atrophy are unchanged.  5.   Remote infarction in the right paramedian occipital lobe.  MRI of the brain  01/15/2013    1.  Patchy left posterior MCA territory acute infarct with mostly cortical involvement as detailed above.  No associated hemorrhage or mass effect. 2.  Chronic right PCA infarct.   MRA of the brain  01/15/2013    Essentially negative intracranial MRA. No left MCA stenosis or major branch occlusion.     2D  Echocardiogram  EF 65-70% with no source of embolus.   Carotid Doppler  No evidence of hemodynamically significant internal carotid artery stenosis. Vertebral artery flow is antegrade.   CXR  01/14/2013   No acute cardiopulmonary abnormality seen  EKG  atrial fibrillation, rate 84.   Therapy Recommendations CIR vs home therapy  Physical Exam   Pleasant elderly caucasian lady not in distress.Awake alert. Afebrile. Head is nontraumatic. Neck is supple with left carotid bruit. Hearing is diminishedl. Cardiac exam no murmur or gallop. Lungs are clear to auscultation. Distal pulses are well felt. Neurological Exam :  Awake alert oriented x3. Nonfluent speech with mild expressive language difficulties and a few paraphasic errors and word hesitation. Good comprehension, naming and repetition. Follows two-step commands. Extraocular movements are full range without nystagmus. She blinks to threat bilaterally. Fundi were not visualized. Vision acuity seems adequate. Face is symmetric without weakness. Tongue is midline. Motor system exam reveals no upper upper or lower extremity drift. Symmetric and equal strength in all 4 extremities. No focal weakness. Touch and pinprick sensation are preserved. Reflexes are symmetric. Plantars are downgoing. Coordination is slow but accurate. Gait was not tested.  ASSESSMENT [name] is a 79 y.o. female presenting with aphasia to [facility]. Transferred to [facility] status post IV t-PA 01/14/2013 at 1239. MRI confirms patchy left MCA infarcts in setting of a old right occipital infarct. Infarcts felt to be embolic secondary to known atrial fibrillation.  On aspirin 81 mg orally every day prior to admission. Now on aspirin 325 mg orally every day for secondary stroke prevention. Patient with resultant headache, expressive aphasia - word finding difficulties, anomia, 1/3 recall. Work up completed.  atrial fibrillation, not on anticoagulation prior to  admission Hypertension Diabetes, HgbA1c 6.3, at goal < 7.0 Hyperlipidemia, LDL 113, on no statin PTA, now on lipitor 10, goal LDL < 100 (< 70 for diabetics) family history of stroke  Hospital day # 2  TREATMENT/PLAN  Change aspirin to eliquis 2.5 mg bid for secondary stroke prevention given atrial fibrillation. Pharmacy to educate  Rehab vs Home with caregiver - await son's arrival to discuss with him. RN to call when he arrives  Annie Main, MSN, RN, ANVP-BC, ANP-BC, [facility] Pager: [phone] 01/16/2013 8:53 AM  Delia Heady, MD

## 2013-01-16 NOTE — Discharge Summary (Signed)
Stroke Discharge Summary  Patient ID: stehanie ekstrom   MRN: [id number]      DOB: [birth year]  Date of Admission: 01/14/2013 Date of Discharge: 01/16/2013  Attending Physician:  Darcella Cheshire, MD, Stroke MD  Consulting Physician(s):   Treatment Team:  Md Stroke, MD,  Faith Rogue, MD (Physical Medicine & Rehabtilitation)  Patient's PCP:  Rudi Heap, MD  Discharge Diagnoses:    Patchy left MCA infarcts s/p IV tPA administration at [facility], transferred to [facility] for treatment. Infarcts embolic secondary to known atrial fibrillation.    Atrial fibrillation  Old right occipital infarct   Headache, expressive aphasia - word finding difficulties, anomia, poor recall - secondary to stroke   Hypertension    Diabetes   Hyperlipidemia   BMI: Body mass index is 19.86 kg/(m^2).  Past Medical History  Diagnosis Date  . Diabetes mellitus   . Hypertension    Past Surgical History  Procedure Laterality Date  . Hip fracture surgery Bilateral       Medication List    STOP taking these medications       aspirin EC 81 MG tablet      TAKE these medications       apixaban 2.5 MG Tabs tablet  Commonly known as:  ELIQUIS  Take 1 tablet (2.5 mg total) by mouth 2 (two) times daily.     atorvastatin 10 MG tablet  Commonly known as:  LIPITOR  Take 1 tablet (10 mg total) by mouth daily at 6 PM.     diltiazem 240 MG 24 hr capsule  Commonly known as:  DILACOR XR  Take 240 mg by mouth daily.     metFORMIN 500 MG 24 hr tablet  Commonly known as:  GLUCOPHAGE-XR  Take 500 mg by mouth daily with breakfast. Take with largest meal.     sertraline 100 MG tablet  Commonly known as:  ZOLOFT  Take 100 mg by mouth daily.     zolpidem 5 MG tablet  Commonly known as:  AMBIEN  Take 1 tablet (5 mg total) by mouth at bedtime as needed for sleep.        LABORATORY STUDIES CBC    Component Value Date/Time   WBC 10.2 01/14/2013 1112   RBC 4.47 01/14/2013 1112   HGB 13.3 01/14/2013 1114    HCT 39.0 01/14/2013 1114   PLT 257 01/14/2013 1112   MCV 86.1 01/14/2013 1112   MCH 29.1 01/14/2013 1112   MCHC 33.8 01/14/2013 1112   RDW 13.2 01/14/2013 1112   LYMPHSABS 2.0 01/14/2013 1112   MONOABS 0.4 01/14/2013 1112   EOSABS 0.1 01/14/2013 1112   BASOSABS 0.1 01/14/2013 1112   CMP    Component Value Date/Time   NA 141 01/14/2013 1114   K 4.5 01/14/2013 1114   CL 105 01/14/2013 1114   CO2 27 01/14/2013 1112   GLUCOSE 215* 01/14/2013 1114   BUN 22 01/14/2013 1114   CREATININE 1.00 01/14/2013 1114   CALCIUM 10.1 01/14/2013 1112   PROT 8.3 01/14/2013 1112   ALBUMIN 4.4 01/14/2013 1112   AST 21 01/14/2013 1112   ALT 9 01/14/2013 1112   ALKPHOS 108 01/14/2013 1112   BILITOT 0.3 01/14/2013 1112   GFRNONAA 49* 01/14/2013 1112   GFRAA 57* 01/14/2013 1112   COAGS Lab Results  Component Value Date   INR 1.04 01/14/2013   INR 3.64* 10/27/2010   INR 1.12 10/08/2010   Lipid Panel    Component Value  Date/Time   CHOL 194 01/15/2013 0355   TRIG 143 01/15/2013 0355   HDL 52 01/15/2013 0355   CHOLHDL 3.7 01/15/2013 0355   VLDL 29 01/15/2013 0355   LDLCALC 113* 01/15/2013 0355   HgbA1C  Lab Results  Component Value Date   HGBA1C 6.3* 01/15/2013   Cardiac Panel (last 3 results)  Recent Labs  01/14/13 1112  TROPONINI <0.30   Urinalysis    Component Value Date/Time   COLORURINE YELLOW 01/14/2013 1130   APPEARANCEUR CLEAR 01/14/2013 1130   LABSPEC 1.010 01/14/2013 1130   PHURINE 6.0 01/14/2013 1130   GLUCOSEU 250* 01/14/2013 1130   HGBUR NEGATIVE 01/14/2013 1130   BILIRUBINUR NEGATIVE 01/14/2013 1130   KETONESUR NEGATIVE 01/14/2013 1130   PROTEINUR NEGATIVE 01/14/2013 1130   UROBILINOGEN 0.2 01/14/2013 1130   NITRITE NEGATIVE 01/14/2013 1130   LEUKOCYTESUR NEGATIVE 01/14/2013 1130   Urine Drug Screen    Component Value Date/Time   LABOPIA NONE DETECTED 01/14/2013 1130   COCAINSCRNUR NONE DETECTED 01/14/2013 1130   LABBENZ NONE DETECTED 01/14/2013 1130   AMPHETMU NONE DETECTED 01/14/2013 1130   THCU NONE DETECTED 01/14/2013 1130   LABBARB NONE DETECTED  01/14/2013 1130    Alcohol Level    Component Value Date/Time   ETH <11 01/14/2013 1112    SIGNIFICANT DIAGNOSTIC STUDIES CT of the brain  01/14/2013 1. No acute intracranial findings. No change since earlier study. 2. Stable age related cerebral atrophy, ventriculomegaly and periventricular white matter disease.  01/14/2013 1. No CT evidence of acute infarction. 2. No intraventricular hemorrhage. 3. Left frontal subdural hygroma is essentially stable 4. Microvascular disease and atrophy are unchanged. 5. Remote infarction in the right paramedian occipital lobe.  MRI of the brain 01/15/2013 1. Patchy left posterior MCA territory acute infarct with mostly cortical involvement as detailed above. No associated hemorrhage or mass effect. 2. Chronic right PCA infarct.  MRA of the brain 01/15/2013 Essentially negative intracranial MRA. No left MCA stenosis or major branch occlusion.  2D Echocardiogram EF 65-70% with no source of embolus.  Carotid Doppler No evidence of hemodynamically significant internal carotid artery stenosis. Vertebral artery flow is antegrade.  CXR 01/14/2013 No acute cardiopulmonary abnormality seen  EKG atrial fibrillation, rate 84.     History of Present Illness   [name] is a 79 y.o. female who was working a crossword at 9:40 am 01/14/2013 when she had sudden onset difficulty. Caregiver was with her at the time. Her symptoms were persistent without improvement until tPA was started 2:59 minutes after time of onset at [facility]. Since that time, her son reports that she has had some improvement, indeed, between my initial exam and subsequent exam 15 minutes later, she had significant improvement. She was transferred and admitted to the neuro ICU at [facility] for further evaluation and treatment.  Hospital Course Patient tolerated tPA without complication. Imaging at 24 hours shows no hemorrhage. MRI confirmed ischemic infarct in the patchy left MCA infarcts in setting of  a old right occipital infarct. Stroke was found to be embolic secondary to known atrial fibrillation. She was not on anticoagulation prior to admission; she was taking aspirin 81 mg daily.  She was initially started on aspirin then changed to  eliquis for secondary stroke prevention.  Her vascular risk factors include: Hypertension (controlled), Diabetes, HgbA1c 6.3, at goal < 7.0, Hyperlipidemia, LDL 113, on no statin PTA, now on lipitor 10, goal LDL < 100 (< 70 for diabetics), family history of stroke.  Patient with continued stroke symptoms of headache, expressive aphasia - word finding difficulties, anomia, poor  recall.  Physical therapy, occupational therapy and speech therapy evaluated patient. She was too high level for inpatient recab; they recommend home health PT and OT. Patient has a 24h care giver. Son is agreeable and supportive.  Discharge Exam  Blood pressure 147/66, pulse 80, temperature 98.1 F (36.7 C), temperature source Oral, resp. rate 19, height 5\' 4"  (1.626 m), weight 52.5 kg (115 lb 11.9 oz), SpO2 95.00%. Pleasant elderly caucasian lady not in distress.Awake alert. Afebrile. Head is nontraumatic. Neck is supple with left carotid bruit. Hearing is diminishedl. Cardiac exam no murmur or gallop. Lungs are clear to auscultation. Distal pulses are well felt.  Neurological Exam  Awake alert oriented x3. Nonfluent speech with mild expressive language difficulties and a few paraphasic errors and word hesitation. Good comprehension, naming and repetition. Follows two-step commands. Extraocular movements are full range without nystagmus. She blinks to threat bilaterally. Fundi were not visualized. Vision acuity seems adequate. Face is symmetric without weakness. Tongue is midline. Motor system exam reveals no upper upper or lower extremity drift. Symmetric and equal strength in all 4 extremities. No focal weakness. Touch and pinprick sensation are preserved. Reflexes are symmetric. Plantars  are downgoing. Coordination is slow but accurate. Gait was not tested.  Discharge Diet   Cardiac thin liquids  Discharge Plan    Disposition:  Home with caregiver  Home health PT and OT   eliquis 2.5 mg bid for secondary stroke prevention - prior approval received from [phone]  Ongoing risk factor control by Primary Care Physician. Risk factor recommendations:  Hypertension target range 130-140/70-80 Lipid range - LDL < 100 and checked every 6 months, fasting Diabetes - HgB A1C <7   Follow-up Rudi Heap, MD in 1 month.  Follow-up with Dr. Delia Heady in 2 months.  Long discussion with the patient and her son regarding the risk benefit of anticoagulation and they understand the risk that she is a fall risk but would still like anticoagulation given the fact that she failed aspirin  45 minutes were spent preparing discharge.  Signed Annie Main, AVNP, ANP-BC, [facility] Nurse Practitioner 01/16/2013, 1:38 PM  I have personally examined this patient, reviewed pertinent data and developed the plan of care. I agree with above.  Delia Heady, MD

## 2013-01-16 NOTE — Care Management Note (Signed)
  Page 1 of 1   01/16/2013     1:31:08 PM   CARE MANAGEMENT NOTE 01/16/2013  Patient:  [name],[name]   Account Number:  [id number]  Date Initiated:  01/16/2013  Documentation initiated by:  Margaret [name]  Subjective/Objective Assessment:     Action/Plan:   Anticipated DC Date:  01/16/2013   Anticipated DC Plan:  HOME W HOME HEALTH SERVICES         Choice offered to / List presented to:  C-1 Patient        HH arranged  HH-2 PT  HH-3 OT      Status of service:  Completed, signed off Medicare Important Message given?   (If response is "NO", the following Medicare IM given date fields will be blank) Date Medicare IM given:   Date Additional Medicare IM given:    Discharge Disposition:  HOME W HOME HEALTH SERVICES  Per UR Regulation:    If discussed at Long Length of Stay Meetings, dates discussed:    Comments:  01-16-13 Meet with patient and her son at bedside. Confirmed facesheet information with son. Provided list of agencies for [facility] to son and patient . Together they picked [facility] . Referral made.  Margaret Flurry RN BSN

## 2013-01-24 ENCOUNTER — Encounter: Payer: Self-pay | Admitting: *Deleted

## 2013-02-11 ENCOUNTER — Other Ambulatory Visit: Payer: Self-pay | Admitting: Family Medicine

## 2013-02-12 NOTE — Telephone Encounter (Signed)
Last seen 08/22/12   DWM

## 2013-02-21 ENCOUNTER — Ambulatory Visit: Payer: Self-pay | Admitting: Family Medicine

## 2013-03-05 ENCOUNTER — Ambulatory Visit (INDEPENDENT_AMBULATORY_CARE_PROVIDER_SITE_OTHER): Payer: Medicare Other | Admitting: Family Medicine

## 2013-03-05 ENCOUNTER — Encounter: Payer: Self-pay | Admitting: Family Medicine

## 2013-03-05 VITALS — BP 175/76 | HR 73 | Temp 97.8°F | Ht 59.75 in | Wt 114.0 lb

## 2013-03-05 DIAGNOSIS — Z8673 Personal history of transient ischemic attack (TIA), and cerebral infarction without residual deficits: Secondary | ICD-10-CM

## 2013-03-05 DIAGNOSIS — E119 Type 2 diabetes mellitus without complications: Secondary | ICD-10-CM

## 2013-03-05 DIAGNOSIS — R5381 Other malaise: Secondary | ICD-10-CM

## 2013-03-05 DIAGNOSIS — I4891 Unspecified atrial fibrillation: Secondary | ICD-10-CM

## 2013-03-05 DIAGNOSIS — R5383 Other fatigue: Secondary | ICD-10-CM

## 2013-03-05 LAB — POCT CBC
Granulocyte percent: 80.1 %G — AB (ref 37–80)
MCV: 86.7 fL (ref 80–97)
MPV: 6.7 fL (ref 0–99.8)
POC Granulocyte: 7.2 — AB (ref 2–6.9)
POC LYMPH PERCENT: 18 %L (ref 10–50)
Platelet Count, POC: 262 10*3/uL (ref 142–424)
RBC: 4.6 M/uL (ref 4.04–5.48)
RDW, POC: 13.5 %

## 2013-03-05 LAB — POCT GLYCOSYLATED HEMOGLOBIN (HGB A1C): Hemoglobin A1C: 6.6

## 2013-03-05 NOTE — Progress Notes (Signed)
  Subjective:    Patient ID: [name], female    DOB: [birth year], 79 y.o.   MRN: [id number]  HPI The patient returns to clinic today for followup of chronic medical problems. These include atrial fibrillation and diabetes mellitus type 2. Most importantly in June she had a CVA and she has apparently recovered remarkably from this. She says her biggest residual from the stroke is run remembering numbers. She is now on eliquis. Her caregiver comes with her to the office visit today. She has minimal complaints, see the review of systems. She is up-to-date on her health maintenance issues.   Review of Systems  Constitutional: Negative.   HENT: Negative.   Eyes: Negative.   Respiratory: Negative.  Cough: dry cough every morning.   Cardiovascular: Negative.   Gastrointestinal: Negative.   Endocrine: Negative.   Genitourinary: Negative.   Musculoskeletal: Positive for myalgias and back pain.  Skin: Negative.   Allergic/Immunologic: Negative.   Neurological: Negative.   Hematological: Negative.   Psychiatric/Behavioral: Negative.        Objective:   Physical Exam BP 175/76  Pulse 73  Temp(Src) 97.8 F (36.6 C) (Oral)  Ht 4' 11.75" (1.518 m)  Wt 114 lb (51.71 kg)  BMI 22.44 kg/m2  Repeat blood pressure right arm is 164/92  The patient appeared well nourished and normally developed her age, alert and oriented to time and place. Speech, behavior and judgement appear normal. Vital signs as documented.  Head exam is unremarkable. No scleral icterus or pallor noted. The left ear canal has cerumen. Nose mouth and throat were normal.  Neck is without jugular venous distension, thyromegally, or carotid bruits. Carotid upstrokes are brisk bilaterally. No cervical adenopathy. Lungs are clear anteriorly and posteriorly to auscultation. Normal respiratory effort. Cardiac exam reveals irregular irregular rate and rhythm at 72 per minute. First and second heart sounds normal.  No murmurs,  rubs or gallops.  Abdominal exam reveals normal bowl sounds, no masses, no organomegaly and no aortic enlargement. No inguinal adenopathy. No axillary adenopathy Extremities are nonedematous and both femoral and pedal pulses are normal. Skin without pallor or jaundice.  Warm and dry, without rash. Neurologic exam reveals normal deep tendon reflexes and normal sensation. She had [name] motor strength bilaterally in both upper and lower extremity. Diabetic foot exam was done         Assessment & Plan:  1. Malaise - POCT CBC - Hepatic function panel - NMR Lipoprofile with Lipids - BASIC METABOLIC PANEL WITH GFR  2. Atrial fibrillation - POCT CBC - Hepatic function panel - NMR Lipoprofile with Lipids - BASIC METABOLIC PANEL WITH GFR  3. Diabetes mellitus type 2 controllrd - POCT CBC - Hepatic function panel - NMR Lipoprofile with Lipids - BASIC METABOLIC PANEL WITH GFR - POCT glycosylated hemoglobin (Hb A1C)  4. History of CVA (cerebrovascular accident) without residual deficits  Patient Instructions  Fall precautions discussed at visit Drink plenty of fluids Use the debrox eardrops for cerumen softening in left ear Continue taking current medication Check home blood pressures,, omron a [name] home monitor to use Bring blood pressures by for review in 3-4 weeks   Nyra Capes MD

## 2013-03-05 NOTE — Patient Instructions (Addendum)
Fall precautions discussed at visit Drink plenty of fluids Use the debrox eardrops for cerumen softening in left ear Continue taking current medication Check home blood pressures,, omron a good home monitor to use Bring blood pressures by for review in 3-4 weeks

## 2013-03-06 LAB — BASIC METABOLIC PANEL WITH GFR
CO2: 28 mEq/L (ref 19–32)
Chloride: 104 mEq/L (ref 96–112)
Creat: 1.21 mg/dL — ABNORMAL HIGH (ref 0.50–1.10)
GFR, Est Non African American: 39 mL/min — ABNORMAL LOW
Sodium: 142 mEq/L (ref 135–145)

## 2013-03-06 LAB — NMR LIPOPROFILE WITH LIPIDS
Cholesterol, Total: 223 mg/dL — ABNORMAL HIGH (ref <200)
HDL Particle Number: 30.2 umol/L — ABNORMAL LOW (ref >=30.5)
HDL-C: 51 mg/dL (ref >=40)
LDL (calc): 132 mg/dL — ABNORMAL HIGH (ref <100)
LDL Particle Number: 1644 nmol/L — ABNORMAL HIGH (ref <1000)
LP-IR Score: 32 (ref <=45)
Small LDL Particle Number: 703 nmol/L — ABNORMAL HIGH (ref <=527)
Triglycerides: 201 mg/dL — ABNORMAL HIGH (ref <150)
VLDL Size: 38.3 nm (ref <=46.6)

## 2013-03-06 LAB — HEPATIC FUNCTION PANEL
ALT: <8 U/L (ref 0–35)
Bilirubin, Direct: 0.1 mg/dL (ref 0.0–0.3)
Indirect Bilirubin: 0.3 mg/dL (ref 0.0–0.9)
Total Bilirubin: 0.4 mg/dL (ref 0.3–1.2)

## 2013-03-13 ENCOUNTER — Other Ambulatory Visit: Payer: Self-pay | Admitting: Family Medicine

## 2013-04-09 ENCOUNTER — Other Ambulatory Visit: Payer: Self-pay | Admitting: Family Medicine

## 2013-05-13 ENCOUNTER — Other Ambulatory Visit: Payer: Self-pay | Admitting: Family Medicine

## 2013-05-14 NOTE — Telephone Encounter (Signed)
Last seen 03/05/13, last filled 11/06/12 with 5 RF, route to pool A, so it can be called into [facility]

## 2013-05-15 ENCOUNTER — Other Ambulatory Visit: Payer: Self-pay | Admitting: Family Medicine

## 2013-05-16 ENCOUNTER — Telehealth: Payer: Self-pay | Admitting: *Deleted

## 2013-05-16 NOTE — Telephone Encounter (Signed)
Ambien called in to [facility] pharmacy

## 2013-06-11 ENCOUNTER — Other Ambulatory Visit: Payer: Self-pay | Admitting: Family Medicine

## 2013-06-13 NOTE — Telephone Encounter (Signed)
Last seen and last glucose 03/05/13  DWM

## 2013-07-01 ENCOUNTER — Other Ambulatory Visit: Payer: Self-pay | Admitting: Family Medicine

## 2013-08-09 ENCOUNTER — Other Ambulatory Visit: Payer: Self-pay | Admitting: Family Medicine

## 2013-08-12 NOTE — Telephone Encounter (Signed)
You may call this prescription in to [facility]

## 2013-08-12 NOTE — Telephone Encounter (Signed)
Last seen 03/05/13  DWM  IF approved route to nurse to call into [facility]

## 2013-08-13 NOTE — Telephone Encounter (Signed)
Called in.

## 2013-08-19 ENCOUNTER — Encounter: Payer: Self-pay | Admitting: Family Medicine

## 2013-08-19 ENCOUNTER — Ambulatory Visit (INDEPENDENT_AMBULATORY_CARE_PROVIDER_SITE_OTHER): Payer: Medicare Other | Admitting: Family Medicine

## 2013-08-19 VITALS — BP 184/92 | HR 97 | Temp 96.8°F | Ht 59.25 in | Wt 112.0 lb

## 2013-08-19 DIAGNOSIS — I4891 Unspecified atrial fibrillation: Secondary | ICD-10-CM | POA: Insufficient documentation

## 2013-08-19 DIAGNOSIS — E559 Vitamin D deficiency, unspecified: Secondary | ICD-10-CM

## 2013-08-19 DIAGNOSIS — I10 Essential (primary) hypertension: Secondary | ICD-10-CM | POA: Insufficient documentation

## 2013-08-19 DIAGNOSIS — E785 Hyperlipidemia, unspecified: Secondary | ICD-10-CM | POA: Insufficient documentation

## 2013-08-19 DIAGNOSIS — Z79899 Other long term (current) drug therapy: Secondary | ICD-10-CM | POA: Insufficient documentation

## 2013-08-19 DIAGNOSIS — E119 Type 2 diabetes mellitus without complications: Secondary | ICD-10-CM | POA: Insufficient documentation

## 2013-08-19 DIAGNOSIS — Z23 Encounter for immunization: Secondary | ICD-10-CM

## 2013-08-19 LAB — POCT CBC
GRANULOCYTE PERCENT: 81.3 % — AB (ref 37–80)
HEMATOCRIT: 39.3 % (ref 37.7–47.9)
Hemoglobin: 12.7 g/dL (ref 12.2–16.2)
Lymph, poc: 1.4 (ref 0.6–3.4)
MCH, POC: 27.7 pg (ref 27–31.2)
MCHC: 32.3 g/dL (ref 31.8–35.4)
MCV: 85.9 fL (ref 80–97)
MPV: 6.4 fL (ref 0–99.8)
PLATELET COUNT, POC: 264 10*3/uL (ref 142–424)
POC GRANULOCYTE: 7.4 — AB (ref 2–6.9)
POC LYMPH %: 15.1 % (ref 10–50)
RBC: 4.6 M/uL (ref 4.04–5.48)
RDW, POC: 13.6 %
WBC: 9.1 10*3/uL (ref 4.6–10.2)

## 2013-08-19 LAB — POCT GLYCOSYLATED HEMOGLOBIN (HGB A1C): HEMOGLOBIN A1C: 6.6

## 2013-08-19 NOTE — Addendum Note (Signed)
Addended by: Zannie Cove on: 08/19/2013 12:30 PM   Modules accepted: Orders

## 2013-08-19 NOTE — Patient Instructions (Addendum)
Continue current medications. Continue good therapeutic lifestyle changes which include good diet and exercise. Fall precautions discussed with patient. Schedule your flu vaccine if you haven't had it yet If you are over 78 years old - you may need Prevnar 40 or the adult Pneumonia vaccine. Monitor blood pressure readings at home and return them for review in 2-3 week Continue to keep hands clean in order to prevent the spread of the flu

## 2013-08-19 NOTE — Addendum Note (Signed)
Addended by: Zannie Cove on: 08/19/2013 12:32 PM   Modules accepted: Orders

## 2013-08-19 NOTE — Progress Notes (Signed)
Subjective:    Patient ID: [name], female    DOB: [birth year], 80 y.o.   MRN: [id number]  HPI Pt here for follow up and management of chronic medical problems. It is of note that patient refuses a lot about the health maintenance parameters that we requested. This includes a Pap smear, mammogram, and DEXA scan. Patient comes in today with her caregiver. Home blood sugar readings have been running anywhere between 98-150. She does not check blood pressures at home. The sitter indicates he she's been having a few more headaches than usual.       Patient Active Problem List   Diagnosis Date Noted  . FRACTURE, HUMERUS, PROXIMAL 09/14/2009  . CLOSED FRACTURE UNSPECIFIED PART NECK FEMUR 09/14/2009   Outpatient Encounter Prescriptions as of 08/19/2013  Medication Sig  . atorvastatin (LIPITOR) 10 MG tablet Take 1 tablet (10 mg total) by mouth daily at 6 PM.  . diltiazem (CARDIZEM CD) 240 MG 24 hr capsule TAKE (1) CAPSULE DAILY  . ELIQUIS 2.5 MG TABS tablet TAKE  (1)  TABLET TWICE A DAY.  . metFORMIN (GLUCOPHAGE-XR) 500 MG 24 hr tablet TAKE 1 TABLET ONCE DAILY WITH LARGEST MEAL.  Marland Kitchen sertraline (ZOLOFT) 100 MG tablet TAKE 1 TABLET DAILY  . zolpidem (AMBIEN) 5 MG tablet TAKE ONE TABLET AT BEDTIME    Review of Systems  Constitutional: Negative.   HENT: Negative.   Eyes: Negative.   Respiratory: Negative.   Cardiovascular: Negative.   Gastrointestinal: Negative.   Endocrine: Negative.   Genitourinary: Negative.   Musculoskeletal: Negative.   Skin: Negative.   Allergic/Immunologic: Negative.   Neurological: Negative.   Hematological: Negative.   Psychiatric/Behavioral: Negative.        Objective:   Physical Exam  Nursing note and vitals reviewed. Constitutional: She is oriented to person, place, and time. She appears well-developed and well-nourished. No distress.  Elderly, but alert and somewhat kyphotic female   HENT:  Head: Normocephalic and atraumatic.  Right Ear:  External ear normal.  Left Ear: External ear normal.  Nose: Nose normal.  Mouth/Throat: Oropharynx is clear and moist.  Eyes: Conjunctivae and EOM are normal. Pupils are equal, round, and reactive to light. Right eye exhibits no discharge. Left eye exhibits no discharge. No scleral icterus.  A lot of redundant skin around her orbits but no sign of any redness or infection there  Neck: Normal range of motion. Neck supple. No thyromegaly present.  Cardiovascular: Normal rate and intact distal pulses.  Exam reveals no gallop and no friction rub.   Murmur heard. Irregular irregular rhythm at 96 per minute with a grade 2/6 systolic ejection murmur  Pulmonary/Chest: Effort normal and breath sounds normal. No respiratory distress. She has no wheezes. She has no rales. She exhibits no tenderness.  Abdominal: Soft. Bowel sounds are normal. She exhibits no distension and no mass. There is no tenderness. There is no rebound and no guarding.  Genitourinary:  Breast exam today revealed no masses or lumps and no axillary adenopathy  Musculoskeletal: Normal range of motion. She exhibits no edema and no tenderness.  Kyphosis  Lymphadenopathy:    She has no cervical adenopathy.  Neurological: She is alert and oriented to person, place, and time. She has normal reflexes.  Skin: Skin is warm and dry.  Psychiatric: She has a normal mood and affect. Her behavior is normal. Judgment and thought content normal.   BP 184/92  Pulse 97  Temp(Src) 96.8 F (36 C) (Oral)  Ht  4' 11.25" (1.505 m)  Wt 112 lb (50.803 kg)  BMI 22.43 kg/m2 Blood pressure recheck right arm sitting 162/90       Assessment & Plan:  1. Diabetes - POCT CBC - POCT glycosylated hemoglobin (Hb A1C)  2. HTN (hypertension) - POCT CBC - Hepatic function panel - BMP8+EGFR  3. Atrial fibrillation - POCT CBC - Hepatic function panel - BMP8+EGFR - NMR, lipoprofile  4. Vitamin D deficiency - Vit D  25 hydroxy (rtn osteoporosis  monitoring)  5. Diabetes mellitus  6. High risk medication use  7. Hypertension  8. Hyperlipidemia Patient Instructions  Continue current medications. Continue good therapeutic lifestyle changes which include good diet and exercise. Fall precautions discussed with patient. Schedule your flu vaccine if you haven't had it yet If you are over 43 years old - you may need Prevnar 44 or the adult Pneumonia vaccine. Monitor blood pressure readings at home and return them for review in 2-3 week Continue to keep hands clean in order to prevent the spread of the flu   Arrie Senate MD

## 2013-08-20 LAB — NMR, LIPOPROFILE
Cholesterol: 247 mg/dL — ABNORMAL HIGH (ref <200)
HDL CHOLESTEROL BY NMR: 61 mg/dL (ref >=40)
HDL Particle Number: 39.7 umol/L (ref >=30.5)
LDL PARTICLE NUMBER: 2196 nmol/L — AB (ref <1000)
LDL SIZE: 20.9 nm (ref >20.5)
LDLC SERPL CALC-MCNC: 152 mg/dL — AB (ref <100)
LP-IR Score: 33 (ref <=45)
Small LDL Particle Number: 663 nmol/L — ABNORMAL HIGH (ref <=527)
TRIGLYCERIDES BY NMR: 172 mg/dL — AB (ref <150)

## 2013-08-20 LAB — BMP8+EGFR
BUN/Creatinine Ratio: 16 (ref 11–26)
BUN: 16 mg/dL (ref 10–36)
CO2: 25 mmol/L (ref 18–29)
CREATININE: 0.98 mg/dL (ref 0.57–1.00)
Calcium: 10.2 mg/dL (ref 8.6–10.2)
Chloride: 101 mmol/L (ref 97–108)
GFR calc non Af Amer: 50 mL/min/{1.73_m2} — ABNORMAL LOW (ref >59)
GFR, EST AFRICAN AMERICAN: 58 mL/min/{1.73_m2} — AB (ref >59)
GLUCOSE: 149 mg/dL — AB (ref 65–99)
POTASSIUM: 4.9 mmol/L (ref 3.5–5.2)
Sodium: 143 mmol/L (ref 134–144)

## 2013-08-20 LAB — VITAMIN D 25 HYDROXY (VIT D DEFICIENCY, FRACTURES): Vit D, 25-Hydroxy: 14.5 ng/mL — ABNORMAL LOW (ref 30.0–100.0)

## 2013-08-20 LAB — SPECIMEN STATUS REPORT

## 2013-08-20 LAB — HEPATIC FUNCTION PANEL
ALT: 7 IU/L (ref 0–32)
AST: 23 IU/L (ref 0–40)
Albumin: 4.6 g/dL (ref 3.2–4.6)
Alkaline Phosphatase: 106 IU/L (ref 39–117)
BILIRUBIN DIRECT: 0.11 mg/dL (ref 0.00–0.40)
Total Bilirubin: 0.3 mg/dL (ref 0.0–1.2)
Total Protein: 7.2 g/dL (ref 6.0–8.5)

## 2013-08-22 ENCOUNTER — Telehealth: Payer: Self-pay | Admitting: Family Medicine

## 2013-08-22 NOTE — Telephone Encounter (Signed)
Message copied by Waverly Ferrari on Thu Aug 22, 2013  4:02 PM ------      Message from: Chipper Herb      Created: Tue Aug 20, 2013  7:02 PM       Liver function tests are within normal limits      On advanced lipid testing, the blood sugar is 149. This is consistent with past readings. The creatinine or the most important kidney function test is good. All the electrolytes are with in normal limits.      On advanced lipid testing the total LDL particle number is elevated at 2196. This is higher than it was 5 months ago. The LDL C. is elevated at 152.++++++ patient should increase her lovastatin to 20 mg daily. Nurse please call a new prescription for atorvastatin 20 mg 1 daily #30 refill as needed. The patient should have liver function test repeated in 4 weeks. The patient must do better with her diet and exercise routine      The vitamin D level is very low.--------- please start vitamin D 50,000 units weekly #12, one weekly-----please call this prescription in for her. Call her daughter in law, [name], with this information       ------

## 2013-08-27 ENCOUNTER — Telehealth: Payer: Self-pay | Admitting: Family Medicine

## 2013-08-27 NOTE — Telephone Encounter (Signed)
Pt notified no samples available 

## 2013-09-02 ENCOUNTER — Other Ambulatory Visit: Payer: Self-pay | Admitting: Family Medicine

## 2013-10-02 ENCOUNTER — Ambulatory Visit: Payer: Medicare Other | Admitting: Cardiology

## 2013-11-06 ENCOUNTER — Other Ambulatory Visit: Payer: Self-pay | Admitting: Family Medicine

## 2013-11-07 NOTE — Telephone Encounter (Signed)
This is okay to refill 

## 2013-11-07 NOTE — Telephone Encounter (Signed)
Patient last seen in office on 08-19-13. Rx last filled on 10-08-13. Please advise. If approved please route to Pool A so nurse can phone in to pharmacy

## 2013-11-08 NOTE — Telephone Encounter (Signed)
rx called into pharmacy

## 2013-11-26 ENCOUNTER — Ambulatory Visit (INDEPENDENT_AMBULATORY_CARE_PROVIDER_SITE_OTHER): Payer: Medicare Other | Admitting: Family Medicine

## 2013-11-26 ENCOUNTER — Encounter: Payer: Self-pay | Admitting: Family Medicine

## 2013-11-26 VITALS — BP 167/81 | HR 70 | Temp 97.3°F | Ht 59.25 in | Wt 112.0 lb

## 2013-11-26 DIAGNOSIS — I4891 Unspecified atrial fibrillation: Secondary | ICD-10-CM

## 2013-11-26 DIAGNOSIS — E785 Hyperlipidemia, unspecified: Secondary | ICD-10-CM

## 2013-11-26 DIAGNOSIS — E119 Type 2 diabetes mellitus without complications: Secondary | ICD-10-CM

## 2013-11-26 DIAGNOSIS — E559 Vitamin D deficiency, unspecified: Secondary | ICD-10-CM

## 2013-11-26 DIAGNOSIS — I10 Essential (primary) hypertension: Secondary | ICD-10-CM

## 2013-11-26 LAB — POCT CBC
Granulocyte percent: 81.5 %G — AB (ref 37–80)
HCT, POC: 35.9 % — AB (ref 37.7–47.9)
HEMOGLOBIN: 11.9 g/dL — AB (ref 12.2–16.2)
LYMPH, POC: 1.4 (ref 0.6–3.4)
MCH, POC: 28.4 pg (ref 27–31.2)
MCHC: 33.2 g/dL (ref 31.8–35.4)
MCV: 85.6 fL (ref 80–97)
MPV: 6.6 fL (ref 0–99.8)
PLATELET COUNT, POC: 275 10*3/uL (ref 142–424)
POC Granulocyte: 7.5 — AB (ref 2–6.9)
POC LYMPH PERCENT: 15 %L (ref 10–50)
RBC: 4.2 M/uL (ref 4.04–5.48)
RDW, POC: 13.5 %
WBC: 9.2 10*3/uL (ref 4.6–10.2)

## 2013-11-26 LAB — POCT GLYCOSYLATED HEMOGLOBIN (HGB A1C): Hemoglobin A1C: 6.3

## 2013-11-26 NOTE — Progress Notes (Signed)
Subjective:    Patient ID: [name], female    DOB: [birth year], 80 y.o.   MRN: [id number]  HPI Pt here for follow up and management of chronic medical problems. Patient comes to the visit today with her caregiver and she has no particular complaints. She is due and FOBT and lab work. She brought him home blood pressure readings and these were in the range of 130s over the 60s to 70s. She says that her home blood sugars are running in the 115 to 120 range. She is not get out of the house a lot.         Patient Active Problem List   Diagnosis Date Noted  . Diabetes mellitus 08/19/2013  . Atrial fibrillation 08/19/2013  . High risk medication use 08/19/2013  . Hypertension 08/19/2013  . Hyperlipidemia 08/19/2013  . CLOSED FRACTURE UNSPECIFIED PART NECK FEMUR, history of 09/14/2009   Outpatient Encounter Prescriptions as of 11/26/2013  Medication Sig  . atorvastatin (LIPITOR) 10 MG tablet Take 1 tablet (10 mg total) by mouth daily at 6 PM.  . diltiazem (CARDIZEM CD) 240 MG 24 hr capsule TAKE (1) CAPSULE DAILY  . ELIQUIS 2.5 MG TABS tablet TAKE (1) TABLET TWICE A DAY.  . metFORMIN (GLUCOPHAGE-XR) 500 MG 24 hr tablet TAKE 1 TABLET ONCE DAILY WITH LARGEST MEAL.  Marland Kitchen sertraline (ZOLOFT) 100 MG tablet TAKE 1 TABLET DAILY  . zolpidem (AMBIEN) 5 MG tablet TAKE ONE TABLET AT BEDTIME    Review of Systems  Constitutional: Negative.   HENT: Negative.   Eyes: Negative.   Respiratory: Negative.   Cardiovascular: Negative.   Gastrointestinal: Negative.   Endocrine: Negative.   Genitourinary: Negative.   Musculoskeletal: Negative.   Skin: Negative.   Allergic/Immunologic: Negative.   Neurological: Negative.   Hematological: Negative.   Psychiatric/Behavioral: Negative.        Objective:   Physical Exam  Nursing note and vitals reviewed. Constitutional: She is oriented to person, place, and time. She appears well-developed and well-nourished. No distress.  Patient is pleasant  and looks good especially for her age  HENT:  Head: Normocephalic and atraumatic.  Right Ear: External ear normal.  Nose: Nose normal.  Mouth/Throat: Oropharynx is clear and moist.  She has ear cerumen in the left ear canal  Eyes: Conjunctivae and EOM are normal. Pupils are equal, round, and reactive to light. Right eye exhibits no discharge. Left eye exhibits no discharge. No scleral icterus.  Neck: Normal range of motion. Neck supple. No thyromegaly present.  Cardiovascular: Normal rate, normal heart sounds and intact distal pulses.  Exam reveals no gallop and no friction rub.   No murmur heard. Irregular irregular rhythm at 72 per minute  Pulmonary/Chest: Effort normal and breath sounds normal. She has no wheezes. She has no rales.  Abdominal: Soft. Bowel sounds are normal. She exhibits no mass. There is no tenderness. There is no rebound and no guarding.  Musculoskeletal: Normal range of motion. She exhibits no edema and no tenderness.  Lymphadenopathy:    She has no cervical adenopathy.  Neurological: She is alert and oriented to person, place, and time. She has normal reflexes.  Skin: Skin is warm and dry. No rash noted.  Psychiatric: She has a normal mood and affect. Her behavior is normal. Judgment and thought content normal.   BP 167/81  Pulse 70  Temp(Src) 97.3 F (36.3 C) (Oral)  Ht 4' 11.25" (1.505 m)  Wt 112 lb (50.803 kg)  BMI 22.43 kg/m2  Assessment & Plan:  1. Diabetes mellitus - POCT CBC - POCT glycosylated hemoglobin (Hb A1C)  2. Atrial fibrillation - POCT CBC  3. Hyperlipidemia - POCT CBC - NMR, lipoprofile  4. Hypertension - POCT CBC - BMP8+EGFR - Hepatic function panel  5. Vitamin D deficiency - Vit D  25 hydroxy (rtn osteoporosis monitoring)  No orders of the defined types were placed in this encounter.   Patient Instructions                       Medicare Annual Wellness Visit  [facility] and the medical providers at [facility] strive to bring you the best medical care.  In doing so we not only want to address your current medical conditions and concerns but also to detect new conditions early and prevent illness, disease and health-related problems.    Medicare offers a yearly Wellness Visit which allows our clinical staff to assess your need for preventative services including immunizations, lifestyle education, counseling to decrease risk of preventable diseases and screening for fall risk and other medical concerns.    This visit is provided free of charge (no copay) for all Medicare recipients. The clinical pharmacists at [facility] have begun to conduct these Wellness Visits which will also include a thorough review of all your medications.    As you primary medical provider recommend that you make an appointment for your Annual Wellness Visit if you have not done so already this year.  You may set up this appointment before you leave today or you may call back ([phone]) and schedule an appointment.  Please make sure when you call that you mention that you are scheduling your Annual Wellness Visit with the clinical pharmacist so that the appointment may be made for the proper length of time.      Continue current medications. Continue good therapeutic lifestyle changes which include good diet and exercise. Fall precautions discussed with patient. If an FOBT was given today- please return it to our front desk. If you are over 54 years old - you may need Prevnar 23 or the adult Pneumonia vaccine.  Continue to monitor blood sugars and blood pressures at home. Return to the FOBT We will call you with the results of the lab work as soon as that is available Keep appointment with the cardiologist   Arrie Senate MD

## 2013-11-26 NOTE — Patient Instructions (Addendum)
Medicare Annual Wellness Visit  [facility] and the medical providers at [facility] strive to bring you the best medical care.  In doing so we not only want to address your current medical conditions and concerns but also to detect new conditions early and prevent illness, disease and health-related problems.    Medicare offers a yearly Wellness Visit which allows our clinical staff to assess your need for preventative services including immunizations, lifestyle education, counseling to decrease risk of preventable diseases and screening for fall risk and other medical concerns.    This visit is provided free of charge (no copay) for all Medicare recipients. The clinical pharmacists at [facility] have begun to conduct these Wellness Visits which will also include a thorough review of all your medications.    As you primary medical provider recommend that you make an appointment for your Annual Wellness Visit if you have not done so already this year.  You may set up this appointment before you leave today or you may call back ([phone]) and schedule an appointment.  Please make sure when you call that you mention that you are scheduling your Annual Wellness Visit with the clinical pharmacist so that the appointment may be made for the proper length of time.      Continue current medications. Continue good therapeutic lifestyle changes which include good diet and exercise. Fall precautions discussed with patient. If an FOBT was given today- please return it to our front desk. If you are over 36 years old - you may need Prevnar 35 or the adult Pneumonia vaccine.  Continue to monitor blood sugars and blood pressures at home. Return to the FOBT We will call you with the results of the lab work as soon as that is available Keep appointment with the cardiologist

## 2013-11-27 LAB — BMP8+EGFR
BUN/Creatinine Ratio: 18 (ref 11–26)
BUN: 18 mg/dL (ref 10–36)
CHLORIDE: 97 mmol/L (ref 97–108)
CO2: 24 mmol/L (ref 18–29)
Calcium: 10 mg/dL (ref 8.7–10.3)
Creatinine, Ser: 1.01 mg/dL — ABNORMAL HIGH (ref 0.57–1.00)
GFR calc Af Amer: 55 mL/min/{1.73_m2} — ABNORMAL LOW (ref >59)
GFR calc non Af Amer: 48 mL/min/{1.73_m2} — ABNORMAL LOW (ref >59)
GLUCOSE: 141 mg/dL — AB (ref 65–99)
Potassium: 4.6 mmol/L (ref 3.5–5.2)
Sodium: 140 mmol/L (ref 134–144)

## 2013-11-27 LAB — NMR, LIPOPROFILE
Cholesterol: 215 mg/dL — ABNORMAL HIGH (ref <200)
HDL Cholesterol by NMR: 54 mg/dL (ref >=40)
HDL Particle Number: 29.4 umol/L — ABNORMAL LOW (ref >=30.5)
LDL Particle Number: 1683 nmol/L — ABNORMAL HIGH (ref <1000)
LDL Size: 20.9 nm (ref >20.5)
LDLC SERPL CALC-MCNC: 133 mg/dL — AB (ref <100)
LP-IR Score: 40 (ref <=45)
Small LDL Particle Number: 849 nmol/L — ABNORMAL HIGH (ref <=527)
TRIGLYCERIDES BY NMR: 139 mg/dL (ref <150)

## 2013-11-27 LAB — HEPATIC FUNCTION PANEL
ALBUMIN: 4.5 g/dL (ref 3.2–4.6)
ALT: 6 IU/L (ref 0–32)
AST: 14 IU/L (ref 0–40)
Alkaline Phosphatase: 96 IU/L (ref 39–117)
Bilirubin, Direct: 0.11 mg/dL (ref 0.00–0.40)
Total Bilirubin: 0.3 mg/dL (ref 0.0–1.2)
Total Protein: 7.4 g/dL (ref 6.0–8.5)

## 2013-11-27 LAB — VITAMIN D 25 HYDROXY (VIT D DEFICIENCY, FRACTURES): Vit D, 25-Hydroxy: 13 ng/mL — ABNORMAL LOW (ref 30.0–100.0)

## 2013-12-02 ENCOUNTER — Other Ambulatory Visit: Payer: Medicare Other

## 2013-12-02 DIAGNOSIS — Z1212 Encounter for screening for malignant neoplasm of rectum: Secondary | ICD-10-CM

## 2013-12-02 NOTE — Progress Notes (Signed)
Pt came in for labs only 

## 2013-12-04 LAB — FECAL OCCULT BLOOD, IMMUNOCHEMICAL: Fecal Occult Bld: NEGATIVE

## 2013-12-09 ENCOUNTER — Encounter: Payer: Self-pay | Admitting: *Deleted

## 2013-12-09 ENCOUNTER — Other Ambulatory Visit: Payer: Self-pay | Admitting: Family Medicine

## 2013-12-11 ENCOUNTER — Ambulatory Visit (INDEPENDENT_AMBULATORY_CARE_PROVIDER_SITE_OTHER): Payer: Medicare Other | Admitting: Cardiology

## 2013-12-11 ENCOUNTER — Encounter: Payer: Self-pay | Admitting: Cardiology

## 2013-12-11 VITALS — BP 168/81 | HR 98 | Ht 59.0 in | Wt 112.0 lb

## 2013-12-11 DIAGNOSIS — I4891 Unspecified atrial fibrillation: Secondary | ICD-10-CM

## 2013-12-11 NOTE — Progress Notes (Signed)
HPI The patient presents as a new patient for evaluation of atrial fib.   I saw her many years ago for this.  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  She has ben on Eliquis since that time.  However, she has done well.  She is limited by her advanced age.  However she still lives at home alone with an aid during the day. She does some very minimal chores.  She does not notice her fibrillation.  The patient denies any new symptoms such as chest discomfort, neck or arm discomfort. There has been no new shortness of breath, PND or orthopnea. There have been no reported palpitations, presyncope or syncope.  She has no bleeding issues.    Allergies  Allergen Reactions  . Ace Inhibitors   . Codeine   . Vytorin [Ezetimibe-Simvastatin]     Current Outpatient Prescriptions  Medication Sig Dispense Refill  . atorvastatin (LIPITOR) 10 MG tablet Take 1 tablet (10 mg total) by mouth daily at 6 PM.  30 tablet  2  . diltiazem (CARDIZEM CD) 240 MG 24 hr capsule TAKE (1) CAPSULE DAILY  30 capsule  2  . ELIQUIS 2.5 MG TABS tablet TAKE (1) TABLET TWICE A DAY.  60 tablet  2  . metFORMIN (GLUCOPHAGE-XR) 500 MG 24 hr tablet TAKE 1 TABLET ONCE DAILY WITH LARGEST MEAL.  30 tablet  2  . ONE TOUCH ULTRA TEST test strip USE TO CHECK BLOOD SUGARS EVERY DAY  100 each  2  . sertraline (ZOLOFT) 100 MG tablet TAKE 1 TABLET DAILY  30 tablet  2  . zolpidem (AMBIEN) 5 MG tablet TAKE ONE TABLET AT BEDTIME  30 tablet  0   No current facility-administered medications for this visit.    Past Medical History  Diagnosis Date  . Diabetes mellitus   . Hypertension   . Fatigue   . Atrial fibrillation   . Basal cell cancer     right shoulder  . Kyphosis     Past Surgical History  Procedure Laterality Date  . Hip fracture surgery Bilateral   . Bone biopsy      x 5  . Eye surgery Right     Family History  Problem Relation Age of Onset  . Diabetes Mother   . Heart disease  Mother     History   Social History  . Marital Status: Widowed    Spouse Name: N/A    Number of Children: N/A  . Years of Education: N/A   Occupational History  . Not on file.   Social History Main Topics  . Smoking status: Never Smoker   . Smokeless tobacco: Not on file  . Alcohol Use: No  . Drug Use: No  . Sexual Activity: Not on file   Other Topics Concern  . Not on file   Social History Narrative  . No narrative on file    ROS:  Dry mouth.  Otherwise as stated in the HPI and negative for all other systems.  PHYSICAL EXAM There were no vitals taken for this visit. GENERAL:  Well appearing for her age 80:  Pupils equal round and reactive, fundi not visualized, oral mucosa unremarkable NECK:  No jugular venous distention, waveform within normal limits, carotid upstroke brisk and symmetric, no bruits, no thyromegaly LYMPHATICS:  No cervical, inguinal adenopathy LUNGS:  Clear to auscultation bilaterally BACK:  No CVA tenderness CHEST:  Unremarkable HEART:  PMI not displaced  or sustained,S1 and S2 within normal limits, no S3, no clicks, no rubs, 3/6 apical systolic murmur radiating out the aortic outflow tract and into the neck bilaterally, no diastolicmurmurs ABD:  Flat, positive bowel sounds normal in frequency in pitch, no bruits, no rebound, no guarding, no midline pulsatile mass, no hepatomegaly, no splenomegaly EXT:  2 plus pulses throughout, no edema, no cyanosis no clubbing SKIN:  No rashes no nodules NEURO:  Cranial nerves II through XII grossly intact, motor grossly intact throughout PSYCH:  Cognitively intact, oriented to person place and time   EKG:  Atrial fibrillation in the, axis within normal limits, intervals in WNL, no acute ST-T wave changes.  12/11/2013   ASSESSMENT AND PLAN  ATRIAL FIB:  The patient  tolerates this rhythm and rate control and anticoagulation. We will continue with the meds as listed.  I don't think further cardiovascular  testing is suggested.  HTN:  Her blood pressure is elevated today but he is normal according to her home diary. No change in therapy as well.  DM:  This is under excellent control. No change in therapy is indicated.  MURMUR:  She probably has some aortic sclerosis or mild stenosis. However, she's not having symptoms and wouldn't want treatment of this. No imaging is indicated.  CVA:  She does have transmitted systolic murmur her carotids and I looked back to 2014. It was less than 39% carotid stenosis. No further imaging is indicated.

## 2013-12-11 NOTE — Patient Instructions (Signed)
The current medical regimen is effective;  continue present plan and medications.  Follow up as needed 

## 2014-01-15 ENCOUNTER — Other Ambulatory Visit: Payer: Self-pay | Admitting: Family Medicine

## 2014-02-05 ENCOUNTER — Other Ambulatory Visit: Payer: Self-pay | Admitting: Family Medicine

## 2014-02-06 NOTE — Telephone Encounter (Signed)
This is okay to refill 

## 2014-02-06 NOTE — Telephone Encounter (Signed)
Patient last seen in office on 11-26-13. Rx last filled on 01-07-14 for #30. Please advise. If approved please route to pool A so nurse can phone in to pharmacy

## 2014-02-07 NOTE — Telephone Encounter (Signed)
rx called into pharmacy

## 2014-03-05 ENCOUNTER — Other Ambulatory Visit: Payer: Self-pay | Admitting: Family Medicine

## 2014-04-03 ENCOUNTER — Ambulatory Visit (INDEPENDENT_AMBULATORY_CARE_PROVIDER_SITE_OTHER): Payer: Medicare Other | Admitting: Family Medicine

## 2014-04-03 ENCOUNTER — Encounter: Payer: Self-pay | Admitting: Family Medicine

## 2014-04-03 VITALS — BP 184/80 | HR 68 | Temp 96.7°F | Ht 59.0 in | Wt 113.0 lb

## 2014-04-03 DIAGNOSIS — E559 Vitamin D deficiency, unspecified: Secondary | ICD-10-CM

## 2014-04-03 DIAGNOSIS — I4891 Unspecified atrial fibrillation: Secondary | ICD-10-CM

## 2014-04-03 DIAGNOSIS — F5104 Psychophysiologic insomnia: Secondary | ICD-10-CM

## 2014-04-03 DIAGNOSIS — E785 Hyperlipidemia, unspecified: Secondary | ICD-10-CM

## 2014-04-03 DIAGNOSIS — G4700 Insomnia, unspecified: Secondary | ICD-10-CM

## 2014-04-03 DIAGNOSIS — E119 Type 2 diabetes mellitus without complications: Secondary | ICD-10-CM

## 2014-04-03 DIAGNOSIS — I10 Essential (primary) hypertension: Secondary | ICD-10-CM

## 2014-04-03 DIAGNOSIS — Z79899 Other long term (current) drug therapy: Secondary | ICD-10-CM

## 2014-04-03 LAB — POCT CBC
Granulocyte percent: 81 %G — AB (ref 37–80)
HEMATOCRIT: 35.5 % — AB (ref 37.7–47.9)
Hemoglobin: 12 g/dL — AB (ref 12.2–16.2)
LYMPH, POC: 1.6 (ref 0.6–3.4)
MCH: 29.1 pg (ref 27–31.2)
MCHC: 34 g/dL (ref 31.8–35.4)
MCV: 85.7 fL (ref 80–97)
MPV: 6.3 fL (ref 0–99.8)
POC Granulocyte: 7.6 — AB (ref 2–6.9)
POC LYMPH PERCENT: 17.4 %L (ref 10–50)
Platelet Count, POC: 275 10*3/uL (ref 142–424)
RBC: 4.1 M/uL (ref 4.04–5.48)
RDW, POC: 13.6 %
WBC: 9.4 10*3/uL (ref 4.6–10.2)

## 2014-04-03 LAB — POCT GLYCOSYLATED HEMOGLOBIN (HGB A1C): Hemoglobin A1C: 6.7

## 2014-04-03 LAB — POCT UA - MICROALBUMIN: Microalbumin Ur, POC: POSITIVE mg/L

## 2014-04-03 NOTE — Progress Notes (Signed)
Subjective:    Patient ID: [name], female    DOB: [birth year], 80 y.o.   MRN: [id number]  HPI Pt here for follow up and management of chronic medical problems. The patient comes in today with her caregiver. She does complain of decreased appetite. She also complains of problems with her hemorrhoids . Her previous   weight was 112 today's weight is 113.        Patient Active Problem List   Diagnosis Date Noted  . Diabetes mellitus 08/19/2013  . Atrial fibrillation 08/19/2013  . High risk medication use 08/19/2013  . Hypertension 08/19/2013  . Hyperlipidemia 08/19/2013  . CLOSED FRACTURE UNSPECIFIED PART NECK FEMUR, history of 09/14/2009   Outpatient Encounter Prescriptions as of 04/03/2014  Medication Sig  . atorvastatin (LIPITOR) 10 MG tablet Take 1 tablet (10 mg total) by mouth daily at 6 PM.  . diltiazem (CARDIZEM CD) 240 MG 24 hr capsule TAKE (1) CAPSULE DAILY  . ELIQUIS 2.5 MG TABS tablet TAKE (1) TABLET TWICE A DAY.  . metFORMIN (GLUCOPHAGE-XR) 500 MG 24 hr tablet TAKE 1 TABLET ONCE DAILY WITH LARGEST MEAL.  Marland Kitchen ONE TOUCH ULTRA TEST test strip USE TO CHECK BLOOD SUGARS EVERY DAY  . sertraline (ZOLOFT) 100 MG tablet TAKE 1 TABLET DAILY  . zolpidem (AMBIEN) 5 MG tablet TAKE ONE TABLET AT BEDTIME    Review of Systems  Constitutional: Positive for appetite change (decrease).  HENT: Negative.   Eyes: Negative.   Respiratory: Negative.   Cardiovascular: Negative.   Gastrointestinal: Negative.   Endocrine: Negative.   Genitourinary: Negative.        Hemorrhoids   Musculoskeletal: Negative.   Skin: Negative.   Allergic/Immunologic: Negative.   Neurological: Negative.   Hematological: Negative.   Psychiatric/Behavioral: Negative.        Objective:   Physical Exam  Nursing note and vitals reviewed. Constitutional: She is oriented to person, place, and time. She appears well-developed and well-nourished. No distress.  The patient is alert and looks excellent  for her almost being 80 years old. Somewhat kyphotic   HENT:  Head: Normocephalic and atraumatic.  Right Ear: External ear normal.  Left Ear: External ear normal.  Nose: Nose normal.  Mouth/Throat: Oropharynx is clear and moist. No oropharyngeal exudate.  Eyes: Conjunctivae and EOM are normal. Pupils are equal, round, and reactive to light. Right eye exhibits no discharge. Left eye exhibits no discharge. No scleral icterus.  Neck: Normal range of motion. Neck supple. No thyromegaly present.  There is a left supraclavicular bruit  Cardiovascular: Normal rate, regular rhythm, normal heart sounds and intact distal pulses.  Exam reveals no gallop and no friction rub.   No murmur heard. The heart has a regular rate and rhythm at 60 per minute with a grade 2/6 systolic ejection murmur  Pulmonary/Chest: Effort normal and breath sounds normal. No respiratory distress. She has no wheezes. She has no rales. She exhibits no tenderness.  Abdominal: Soft. Bowel sounds are normal. She exhibits no mass. There is no tenderness. There is no rebound and no guarding.  There were no inguinal nodes.  Genitourinary:  The breasts were checked today and there were no lumps or masses. The axillary region was clear.  Musculoskeletal: Normal range of motion. She exhibits no edema and no tenderness.  Range of motion is somewhat hesitant but she is able to walk  Lymphadenopathy:    She has no cervical adenopathy.  Neurological: She is alert and oriented to person, place,  and time. She has normal reflexes. No cranial nerve deficit.  Skin: Skin is warm and dry. No rash noted.  Psychiatric: She has a normal mood and affect. Her behavior is normal. Judgment and thought content normal.   BP 184/80  Pulse 68  Temp(Src) 96.7 F (35.9 C) (Oral)  Ht _0  (1.499 m)  Wt 113 lb (51.256 kg)  BMI 22.81 kg/m2        Assessment & Plan:  1. Type 2 diabetes mellitus without complication - POCT CBC - POCT glycosylated  hemoglobin (Hb A1C) - POCT UA - Microalbumin  2. Atrial fibrillation, unspecified - POCT CBC  3. Hyperlipidemia - POCT CBC - Lipid panel  4. Essential hypertension - POCT CBC - BMP8+EGFR - Hepatic function panel  5. High risk medication use - POCT CBC  6. Vitamin D deficiency - Vit D  25 hydroxy (rtn osteoporosis monitoring)  7. Chronic insomnia  No orders of the defined types were placed in this encounter.    Patient Instructions                       Medicare Annual Wellness Visit  [facility] and the medical providers at [facility] strive to bring you the best medical care.  In doing so we not only want to address your current medical conditions and concerns but also to detect new conditions early and prevent illness, disease and health-related problems.    Medicare offers a yearly Wellness Visit which allows our clinical staff to assess your need for preventative services including immunizations, lifestyle education, counseling to decrease risk of preventable diseases and screening for fall risk and other medical concerns.    This visit is provided free of charge (no copay) for all Medicare recipients. The clinical pharmacists at [facility] have begun to conduct these Wellness Visits which will also include a thorough review of all your medications.    As you primary medical provider recommend that you make an appointment for your Annual Wellness Visit if you have not done so already this year.  You may set up this appointment before you leave today or you may call back ([phone]) and schedule an appointment.  Please make sure when you call that you mention that you are scheduling your Annual Wellness Visit with the clinical pharmacist so that the appointment may be made for the proper length of time.     Continue current medications. Continue good therapeutic lifestyle changes which include good diet and exercise. Fall  precautions discussed with patient. If an FOBT was given today- please return it to our front desk. If you are over 47 years old - you may need Prevnar 2 or the adult Pneumonia vaccine.  Flu Shots will be available at our office starting mid- September. Please call and schedule a FLU CLINIC APPOINTMENT.   Continue to monitor blood pressures and blood sugars at home--bring readings when you come to the office Check feet regularly   Arrie Senate MD

## 2014-04-03 NOTE — Patient Instructions (Addendum)
Medicare Annual Wellness Visit  [facility] and the medical providers at [facility] strive to bring you the best medical care.  In doing so we not only want to address your current medical conditions and concerns but also to detect new conditions early and prevent illness, disease and health-related problems.    Medicare offers a yearly Wellness Visit which allows our clinical staff to assess your need for preventative services including immunizations, lifestyle education, counseling to decrease risk of preventable diseases and screening for fall risk and other medical concerns.    This visit is provided free of charge (no copay) for all Medicare recipients. The clinical pharmacists at [facility] have begun to conduct these Wellness Visits which will also include a thorough review of all your medications.    As you primary medical provider recommend that you make an appointment for your Annual Wellness Visit if you have not done so already this year.  You may set up this appointment before you leave today or you may call back ([phone]) and schedule an appointment.  Please make sure when you call that you mention that you are scheduling your Annual Wellness Visit with the clinical pharmacist so that the appointment may be made for the proper length of time.     Continue current medications. Continue good therapeutic lifestyle changes which include good diet and exercise. Fall precautions discussed with patient. If an FOBT was given today- please return it to our front desk. If you are over 34 years old - you may need Prevnar 80 or the adult Pneumonia vaccine.  Flu Shots will be available at our office starting mid- September. Please call and schedule a FLU CLINIC APPOINTMENT.   Continue to monitor blood pressures and blood sugars at home--bring readings when you come to the office Check feet regularly

## 2014-04-03 NOTE — Addendum Note (Signed)
Addended by: Selmer Dominion on: 04/03/2014 11:35 AM   Modules accepted: Orders

## 2014-04-04 ENCOUNTER — Other Ambulatory Visit: Payer: Self-pay | Admitting: *Deleted

## 2014-04-04 DIAGNOSIS — E785 Hyperlipidemia, unspecified: Secondary | ICD-10-CM

## 2014-04-04 LAB — HEPATIC FUNCTION PANEL
ALBUMIN: 4.6 g/dL (ref 3.2–4.6)
ALT: 10 IU/L (ref 0–32)
AST: 21 IU/L (ref 0–40)
Alkaline Phosphatase: 96 IU/L (ref 39–117)
BILIRUBIN DIRECT: 0.07 mg/dL (ref 0.00–0.40)
TOTAL PROTEIN: 7.2 g/dL (ref 6.0–8.5)
Total Bilirubin: 0.2 mg/dL (ref 0.0–1.2)

## 2014-04-04 LAB — BMP8+EGFR
BUN/Creatinine Ratio: 20 (ref 11–26)
BUN: 21 mg/dL (ref 10–36)
CALCIUM: 10.2 mg/dL (ref 8.7–10.3)
CO2: 26 mmol/L (ref 18–29)
Chloride: 102 mmol/L (ref 97–108)
Creatinine, Ser: 1.05 mg/dL — ABNORMAL HIGH (ref 0.57–1.00)
GFR, EST AFRICAN AMERICAN: 53 mL/min/{1.73_m2} — AB (ref >59)
GFR, EST NON AFRICAN AMERICAN: 46 mL/min/{1.73_m2} — AB (ref >59)
GLUCOSE: 153 mg/dL — AB (ref 65–99)
Potassium: 5.2 mmol/L (ref 3.5–5.2)
SODIUM: 143 mmol/L (ref 134–144)

## 2014-04-04 LAB — VITAMIN D 25 HYDROXY (VIT D DEFICIENCY, FRACTURES): Vit D, 25-Hydroxy: 16.5 ng/mL — ABNORMAL LOW (ref 30.0–100.0)

## 2014-04-04 LAB — LIPID PANEL
CHOL/HDL RATIO: 3.8 ratio (ref 0.0–4.4)
CHOLESTEROL TOTAL: 221 mg/dL — AB (ref 100–199)
HDL: 58 mg/dL (ref >39)
LDL CALC: 131 mg/dL — AB (ref 0–99)
Triglycerides: 159 mg/dL — ABNORMAL HIGH (ref 0–149)
VLDL CHOLESTEROL CAL: 32 mg/dL (ref 5–40)

## 2014-04-04 LAB — MICROALBUMIN, URINE: MICROALBUM., U, RANDOM: 45.2 ug/mL — AB (ref 0.0–17.0)

## 2014-04-04 MED ORDER — ATORVASTATIN CALCIUM 20 MG PO TABS: 20.0000 mg | ORAL_TABLET | Freq: Every day | ORAL | Status: DC

## 2014-04-04 NOTE — Progress Notes (Signed)
The combo can increase atorvastatin plasma concentrations.  I recommend increase to atorvastatin 20mg  daily and continue diltiazem at current dose.  Monitor for s/s of myalgias, rhabdomyolysis (dark urine, muscle pain).  Can check BMET and CPK in 4-6 weeks to montior for problems.

## 2014-04-04 NOTE — Progress Notes (Signed)
Prescription sent electronically and future order placed for labs

## 2014-04-04 NOTE — Progress Notes (Signed)
Dr Laurance Flatten wants to increase her lipitor to 20mg  from 10mg .  There is a warning that concurrent use with diltiazim can increase risk for myopathy and rhabdomyolysis. Do you feel this increase is appropriate?

## 2014-04-10 ENCOUNTER — Other Ambulatory Visit: Payer: Self-pay | Admitting: *Deleted

## 2014-04-10 MED ORDER — HYDROCORTISONE ACE-PRAMOXINE 1-1 % RE CREA: 1.0000 "application " | TOPICAL_CREAM | Freq: Two times a day (BID) | RECTAL | Status: DC

## 2014-04-10 NOTE — Telephone Encounter (Signed)
[facility]  procta cream

## 2014-05-07 ENCOUNTER — Other Ambulatory Visit: Payer: Self-pay | Admitting: Family Medicine

## 2014-05-08 NOTE — Telephone Encounter (Signed)
Last filled 02/06/2014.

## 2014-05-22 ENCOUNTER — Other Ambulatory Visit: Payer: Self-pay | Admitting: *Deleted

## 2014-05-22 MED ORDER — METFORMIN HCL ER 500 MG PO TB24: ORAL_TABLET | ORAL | Status: DC

## 2014-05-22 MED ORDER — ATORVASTATIN CALCIUM 20 MG PO TABS: 20.0000 mg | ORAL_TABLET | Freq: Every day | ORAL | Status: DC

## 2014-05-27 ENCOUNTER — Encounter (HOSPITAL_COMMUNITY): Payer: Self-pay | Admitting: Emergency Medicine

## 2014-05-27 ENCOUNTER — Emergency Department (HOSPITAL_COMMUNITY)
Admission: EM | Admit: 2014-05-27 | Discharge: 2014-05-27 | Disposition: A | Discharge status: Home / Self Care | Payer: Medicare Other | Attending: Emergency Medicine | Admitting: Emergency Medicine

## 2014-05-27 ENCOUNTER — Emergency Department (HOSPITAL_COMMUNITY): Payer: Medicare Other

## 2014-05-27 DIAGNOSIS — I10 Essential (primary) hypertension: Secondary | ICD-10-CM | POA: Diagnosis not present

## 2014-05-27 DIAGNOSIS — E119 Type 2 diabetes mellitus without complications: Secondary | ICD-10-CM | POA: Diagnosis not present

## 2014-05-27 DIAGNOSIS — S42352A Displaced comminuted fracture of shaft of humerus, left arm, initial encounter for closed fracture: Secondary | ICD-10-CM | POA: Diagnosis not present

## 2014-05-27 DIAGNOSIS — W07XXXA Fall from chair, initial encounter: Secondary | ICD-10-CM | POA: Diagnosis not present

## 2014-05-27 DIAGNOSIS — Z8739 Personal history of other diseases of the musculoskeletal system and connective tissue: Secondary | ICD-10-CM | POA: Insufficient documentation

## 2014-05-27 DIAGNOSIS — Y939 Activity, unspecified: Secondary | ICD-10-CM | POA: Insufficient documentation

## 2014-05-27 DIAGNOSIS — Y929 Unspecified place or not applicable: Secondary | ICD-10-CM | POA: Diagnosis not present

## 2014-05-27 DIAGNOSIS — Z79899 Other long term (current) drug therapy: Secondary | ICD-10-CM | POA: Insufficient documentation

## 2014-05-27 DIAGNOSIS — I4891 Unspecified atrial fibrillation: Secondary | ICD-10-CM | POA: Insufficient documentation

## 2014-05-27 DIAGNOSIS — Z85828 Personal history of other malignant neoplasm of skin: Secondary | ICD-10-CM | POA: Diagnosis not present

## 2014-05-27 DIAGNOSIS — S4992XA Unspecified injury of left shoulder and upper arm, initial encounter: Secondary | ICD-10-CM | POA: Diagnosis present

## 2014-05-27 MED ORDER — MORPHINE SULFATE 2 MG/ML IJ SOLN
2.0000 mg | Freq: Once | INTRAMUSCULAR | Status: AC
  Administered 2014-05-27: 2 mg via INTRAVENOUS

## 2014-05-27 MED ORDER — OXYCODONE-ACETAMINOPHEN 5-325 MG PO TABS: 1.0000 | ORAL_TABLET | ORAL | Status: DC | PRN

## 2014-05-27 MED ORDER — MORPHINE SULFATE 2 MG/ML IJ SOLN
2.0000 mg | Freq: Once | INTRAMUSCULAR | Status: AC
  Administered 2014-05-27: 2 mg via INTRAVENOUS
  Filled 2014-05-27: qty 1

## 2014-05-27 MED ORDER — ONDANSETRON 4 MG PO TBDP: ORAL_TABLET | ORAL | Status: DC

## 2014-05-27 MED ORDER — KETOROLAC TROMETHAMINE 30 MG/ML IJ SOLN
30.0000 mg | Freq: Once | INTRAMUSCULAR | Status: AC
  Administered 2014-05-27: 30 mg via INTRAVENOUS
  Filled 2014-05-27: qty 1

## 2014-05-27 MED ORDER — OXYCODONE-ACETAMINOPHEN 5-325 MG PO TABS: 2.0000 | ORAL_TABLET | ORAL | Status: DC | PRN

## 2014-05-27 MED ORDER — MORPHINE SULFATE 2 MG/ML IJ SOLN
INTRAMUSCULAR | Status: AC
  Filled 2014-05-27: qty 1

## 2014-05-27 NOTE — ED Notes (Signed)
Discharge instructions given, pt demonstrated teach back and verbal understanding. No concerns voiced.  

## 2014-05-27 NOTE — Discharge Instructions (Signed)
You have fractured your humerus bone.   Sling immobilizer. Ice. Pain medication. Follow up with Dr. Aline Brochure. Call office tomorrow for appointment

## 2014-05-27 NOTE — ED Provider Notes (Signed)
CSN: [id number]     Arrival date & time 05/27/14  1728 History  This chart was scribed for Nat Christen, MD by Lowella Petties, ED Scribe. The patient was seen in room APA18/APA18. Patient's care was started at 5:49 PM.   Chief Complaint  Patient presents with  . Shoulder Pain   The history is provided by the patient. No language interpreter was used.   HPI Comments: [name] is a 80 y.o. female who presents to the Emergency Department complaining of constant, severe, throbbing, left shoulder pain after falling out of her chair when reaching for a magazine earlier this afternoon. She denies LOC or any other injuries. No head or neck trauma. Severity is moderate.  Past Medical History  Diagnosis Date  . Diabetes mellitus   . Hypertension   . Fatigue   . Atrial fibrillation   . Basal cell cancer     right shoulder  . Kyphosis    Past Surgical History  Procedure Laterality Date  . Hip fracture surgery Bilateral   . Bone biopsy      x 5  . Eye surgery Right    Family History  Problem Relation Age of Onset  . Diabetes Mother   . Heart disease Mother    History  Substance Use Topics  . Smoking status: Never Smoker   . Smokeless tobacco: Not on file  . Alcohol Use: No   OB History   Grav Para Term Preterm Abortions TAB SAB Ect Mult Living                 Review of Systems A complete 10 system review of systems was obtained and all systems are negative except as noted in the HPI and PMH.   Allergies  Ace inhibitors; Codeine; and Vytorin  Home Medications   Prior to Admission medications   Medication Sig Start Date End Date Taking? Authorizing Provider  AFLURIA PRESERVATIVE FREE 0.5 ML SUSY Inject into the muscle once. 05/26/14  Yes Historical Provider, MD  apixaban (ELIQUIS) 2.5 MG TABS tablet Take 2.5 mg by mouth 2 (two) times daily.   Yes Historical Provider, MD  diltiazem (CARDIZEM CD) 240 MG 24 hr capsule Take 240 mg by mouth daily.   Yes Historical Provider,  MD  metFORMIN (GLUCOPHAGE-XR) 500 MG 24 hr tablet Take 500 mg by mouth daily with breakfast.   Yes Historical Provider, MD  sertraline (ZOLOFT) 100 MG tablet Take 100 mg by mouth daily.   Yes Historical Provider, MD  zolpidem (AMBIEN) 5 MG tablet Take 5 mg by mouth at bedtime.   Yes Historical Provider, MD  ondansetron (ZOFRAN ODT) 4 MG disintegrating tablet 4mg  ODT q4 hours prn nausea/vomit 05/27/14   Nat Christen, MD  oxyCODONE-acetaminophen (PERCOCET) 5-325 MG per tablet Take 1-2 tablets by mouth every 4 (four) hours as needed. 05/27/14   Nat Christen, MD  oxyCODONE-acetaminophen (PERCOCET) 5-325 MG per tablet Take 2 tablets by mouth every 4 (four) hours as needed. 05/27/14   Nat Christen, MD   Triage Vitals: BP 202/69  Pulse 75  Temp(Src) 97.4 F (36.3 C) (Oral)  Resp 20  Ht 4\' 11"  (1.499 m)  Wt 112 lb (50.803 kg)  BMI 22.61 kg/m2  SpO2 98% Physical Exam  Nursing note and vitals reviewed. Constitutional: She is oriented to person, place, and time. She appears well-developed and well-nourished.  HENT:  Head: Normocephalic and atraumatic.  Eyes: Conjunctivae and EOM are normal. Pupils are equal, round, and reactive to  light.  Neck: Normal range of motion. Neck supple.  Cardiovascular: Normal rate, regular rhythm and normal heart sounds.   Pulmonary/Chest: Effort normal and breath sounds normal.  Abdominal: Soft. Bowel sounds are normal.  Musculoskeletal: Normal range of motion.  Left shoulder painful to touch and with ROM.  Neurological: She is alert and oriented to person, place, and time.  Skin: Skin is warm and dry.  Psychiatric: She has a normal mood and affect. Her behavior is normal.    ED Course  Procedures (including critical care time) DIAGNOSTIC STUDIES: Oxygen Saturation is 98% on room air, normal by my interpretation.    COORDINATION OF CARE: 5:54 PM-Discussed treatment plan which includes left shoulder X-ray and pain medication with pt at bedside and pt agreed to  plan.   Results for orders placed in visit on 04/03/14  [facility]      Result Value Ref Range   Glucose 153 (*) 65 - 99 mg/dL   BUN 21  10 - 36 mg/dL   Creatinine, Ser 1.05 (*) 0.57 - 1.00 mg/dL   GFR calc non Af Amer 46 (*) >59 mL/min/1.73   GFR calc Af Amer 53 (*) >59 mL/min/1.73   BUN/Creatinine Ratio 20  11 - 26   Sodium 143  134 - 144 mmol/L   Potassium 5.2  3.5 - 5.2 mmol/L   Chloride 102  97 - 108 mmol/L   CO2 26  18 - 29 mmol/L   Calcium 10.2  8.7 - 10.3 mg/dL  HEPATIC FUNCTION PANEL      Result Value Ref Range   Total Protein 7.2  6.0 - 8.5 g/dL   Albumin 4.6  3.2 - 4.6 g/dL   Total Bilirubin 0.2  0.0 - 1.2 mg/dL   Bilirubin, Direct 0.07  0.00 - 0.40 mg/dL   Alkaline Phosphatase 96  39 - 117 IU/L   AST 21  0 - 40 IU/L   ALT 10  0 - 32 IU/L  LIPID PANEL      Result Value Ref Range   Cholesterol, Total 221 (*) 100 - 199 mg/dL   Triglycerides 159 (*) 0 - 149 mg/dL   HDL 58  >39 mg/dL   VLDL Cholesterol Cal 32  5 - 40 mg/dL   LDL Calculated 131 (*) 0 - 99 mg/dL   Chol/HDL Ratio 3.8  0.0 - 4.4 ratio units  VITAMIN D 25 HYDROXY      Result Value Ref Range   Vit D, 25-Hydroxy 16.5 (*) 30.0 - 100.0 ng/mL  MICROALBUMIN, URINE      Result Value Ref Range   Microalbum.,U,Random 45.2 (*) 0.0 - 17.0 ug/mL  POCT CBC      Result Value Ref Range   WBC 9.4  4.6 - 10.2 K/uL   Lymph, poc 1.6  0.6 - 3.4   POC LYMPH PERCENT 17.4  10 - 50 %L   POC Granulocyte 7.6 (*) 2 - 6.9   Granulocyte percent 81.0 (*) 37 - 80 %G   RBC 4.1  4.04 - 5.48 M/uL   Hemoglobin 12.0 (*) 12.2 - 16.2 g/dL   HCT, POC 35.5 (*) 37.7 - 47.9 %   MCV 85.7  80 - 97 fL   MCH, POC 29.1  27 - 31.2 pg   MCHC 34.0  31.8 - 35.4 g/dL   RDW, POC 13.6     Platelet Count, POC 275.0  142 - 424 K/uL   MPV 6.3  0 - 99.8 fL  POCT GLYCOSYLATED  HEMOGLOBIN (HGB A1C)      Result Value Ref Range   Hemoglobin A1C 6.7%    POCT UA - MICROALBUMIN      Result Value Ref Range   Microalbumin Ur, POC pos+50      Imaging  Review Dg Shoulder Left  05/27/2014   CLINICAL DATA:  Falling from chair earlier this afternoon now with persistent left-sided shoulder pain. Visible deformity, swelling and lateral bruising involving the shoulder. Initial encounter.  EXAM: LEFT SHOULDER - 2+ VIEW  COMPARISON:  None.  FINDINGS: There is a slightly comminuted and impacted fracture involving the anatomic neck of the humerus with foreshortening and accentuated varus angulation. This finding is associated with a large amount of adjacent soft tissue swelling and a suspected joint effusion. No radiopaque foreign body. Limited visualization adjacent thorax demonstrates atherosclerotic plaque within the thoracic aorta. Atherosclerotic plaque within the left carotid bulb.  IMPRESSION: Comminuted and slightly impacted fracture involving the anatomic neck of the humerus with foreshortening and accentuated varus angulation.   Electronically Signed   By: Sandi Mariscal M.D.   On: 05/27/2014 19:01     EKG Interpretation None      MDM   Final diagnoses:  Closed comminuted fracture of left humerus, initial encounter   No head or neck trauma. Plain films of left humerus show a comminuted and impacted fracture along the anatomical neck of the humerus.  Discussed with Dr. Aline Brochure. He will see patient in the office. Sling immobilizer, ice, pain medication. Discussed with patient and her son  I personally performed the services described in this documentation, which was scribed in my presence. The recorded information has been reviewed and is accurate.    Nat Christen, MD 05/27/14 2106

## 2014-05-27 NOTE — ED Notes (Signed)
EMS reports that pt fell out of her chair while reaching over to get a magazine.  C/O pain to left shoulder with deformity noted.  EMS applied shoulder sling and administered 4mg  zofran and 6mg  morphine pta.

## 2014-05-29 ENCOUNTER — Ambulatory Visit (INDEPENDENT_AMBULATORY_CARE_PROVIDER_SITE_OTHER): Payer: Medicare Other | Admitting: Orthopedic Surgery

## 2014-05-29 ENCOUNTER — Encounter: Payer: Self-pay | Admitting: Orthopedic Surgery

## 2014-05-29 VITALS — Resp 22 | Ht 59.0 in | Wt 112.0 lb

## 2014-05-29 DIAGNOSIS — S42202A Unspecified fracture of upper end of left humerus, initial encounter for closed fracture: Secondary | ICD-10-CM

## 2014-05-29 DIAGNOSIS — S42209A Unspecified fracture of upper end of unspecified humerus, initial encounter for closed fracture: Secondary | ICD-10-CM | POA: Insufficient documentation

## 2014-05-29 MED ORDER — PROMETHAZINE HCL 12.5 MG PO TABS: 12.5000 mg | ORAL_TABLET | Freq: Four times a day (QID) | ORAL | Status: DC | PRN

## 2014-05-29 MED ORDER — HYDROCODONE-ACETAMINOPHEN 5-325 MG PO TABS: 1.0000 | ORAL_TABLET | Freq: Four times a day (QID) | ORAL | Status: DC | PRN

## 2014-05-29 NOTE — Progress Notes (Signed)
Subjective:     MCKENZI BUONOMO is a 80 y.o. right handed female referred by ED for evaluation and treatment left  shoulder pain. This is evaluated as a personal injury. The pain is described as aching, sharp and throbbing.  The onset of the pain was sudden, related to a fall from standing. Mechanism of injury: fall.  The pain occurs continuously   Location is anterior, lateral, global. No history of dislocation. Symptoms are aggravated by all activities.    Outside reports reviewed: ER records.  The following portions of the patient's history were reviewed and updated as appropriate: allergies, current medications, past family history, past medical history, past social history, past surgical history and problem list.      Objective:    General:  alert, cooperative and mild distress  Gait:  wheel chair and miniaml ambulation . The patient cannot bear weight on the injured extremity.   General appearance is normal she is oriented x3 her mood and affect are normal she is in some distress from pain. She has a large swelling over the left proximal humerus with tenderness crepitance. Decreased range of motion. Stability cannot be assessed. Muscle tone was normal cannot assess strength. Skin is intact with a lot of ecchymosis. The pulse and perfusion of the left upper extremity is normal. There is no lymphadenopathy in the left axilla or supraclavicular region. Normal sensation is noted over the lateral arm.  Imaging My interpretation of the left shoulder x-rays that she has a head splitting type proximal humerus fracture    Assessment:  Left proximal humerus fracture in a 80 year old female with osteoporosis and atrial fibrillation history of mini stroke who is not an operative candidate based on her medical history. Plan:  Optimal treatment would be hip replacement however based on her medical condition she will be treated with closed sling immobilization except for deformity and limitations of  range of motion and strength. X-ray in 3 weeks. Meds ordered this encounter  Medications  . HYDROcodone-acetaminophen (NORCO/VICODIN) 5-325 MG per tablet    Sig: Take 1 tablet by mouth every 6 (six) hours as needed for moderate pain.    Dispense:  84 tablet    Refill:  0  . promethazine (PHENERGAN) 12.5 MG tablet    Sig: Take 1 tablet (12.5 mg total) by mouth every 6 (six) hours as needed for nausea or vomiting.    Dispense:  84 tablet    Refill:  0

## 2014-06-05 ENCOUNTER — Other Ambulatory Visit: Payer: Self-pay | Admitting: Family Medicine

## 2014-06-09 MED FILL — Oxycodone w/ Acetaminophen Tab 5-325 MG: ORAL | Qty: 6 | Status: AC

## 2014-06-10 ENCOUNTER — Telehealth: Payer: Self-pay | Admitting: Orthopedic Surgery

## 2014-06-10 NOTE — Telephone Encounter (Signed)
Patient and sister, Dot Aleene [name], are asking if patient may use any type of topical ointment to help with the shoulder pain?  She is under proximal humerus fracture care, next scheduled appointment here is 06/19/14.  Patient's ph# is [phone].  Caregiver is also with patient today.

## 2014-06-10 NOTE — Telephone Encounter (Signed)
yes

## 2014-06-10 NOTE — Telephone Encounter (Signed)
Routing to Dr Harrison 

## 2014-06-11 NOTE — Telephone Encounter (Signed)
Attempted call back, phone either does not ring or busy signal only

## 2014-06-12 NOTE — Telephone Encounter (Signed)
ATTEMPTED CALL TO PATIENT, BUSY SIGNAL ONLY

## 2014-06-19 ENCOUNTER — Encounter: Payer: Self-pay | Admitting: Orthopedic Surgery

## 2014-06-19 ENCOUNTER — Ambulatory Visit (INDEPENDENT_AMBULATORY_CARE_PROVIDER_SITE_OTHER): Payer: Medicare Other

## 2014-06-19 ENCOUNTER — Ambulatory Visit (INDEPENDENT_AMBULATORY_CARE_PROVIDER_SITE_OTHER): Payer: Self-pay | Admitting: Orthopedic Surgery

## 2014-06-19 VITALS — BP 163/90 | Ht 59.0 in | Wt 112.0 lb

## 2014-06-19 DIAGNOSIS — S4292XD Fracture of left shoulder girdle, part unspecified, subsequent encounter for fracture with routine healing: Secondary | ICD-10-CM

## 2014-06-19 MED ORDER — HYDROCODONE-ACETAMINOPHEN 5-325 MG PO TABS: 1.0000 | ORAL_TABLET | Freq: Four times a day (QID) | ORAL | Status: DC | PRN

## 2014-06-19 NOTE — Patient Instructions (Signed)
We will order home health physical therapy

## 2014-06-19 NOTE — Progress Notes (Signed)
Patient ID: [name], female   DOB: 10-22-1917, 80 y.o.   MRN: [id number] Chief Complaint  Patient presents with  . Follow-up    3 week recheck on left shoulder fracture with xray. DOI 05-27-14.    Three-week post fracture left proximal humerus with varus alignment that we could not operate on. Pain is better except at night takes 1 or 2 pills after 7:00  Still not ambulating because she usually uses a walker. Mild tenderness proximal humerus x-ray shows varus alignment of the fracture fracture is healing  Recommend home therapy follow-up 8 weeks check range of motion and functional assessment

## 2014-06-20 ENCOUNTER — Other Ambulatory Visit: Payer: Self-pay | Admitting: *Deleted

## 2014-06-20 DIAGNOSIS — S42202A Unspecified fracture of upper end of left humerus, initial encounter for closed fracture: Secondary | ICD-10-CM

## 2014-06-26 ENCOUNTER — Telehealth: Payer: Self-pay | Admitting: Family Medicine

## 2014-06-26 NOTE — Telephone Encounter (Signed)
Left message with Tillie Rung, last A1c was in August .  Result was 6.7%

## 2014-06-27 ENCOUNTER — Telehealth: Payer: Self-pay | Admitting: Orthopedic Surgery

## 2014-06-27 NOTE — Telephone Encounter (Signed)
Tilford Pillar w/[facility] is calling asking questions regarding [name] being able to take Tylenol 500 mg please advise?

## 2014-07-01 NOTE — Telephone Encounter (Signed)
Returned call to [name], no answer, left vm x 2

## 2014-07-07 ENCOUNTER — Telehealth: Payer: Self-pay | Admitting: Orthopedic Surgery

## 2014-07-07 NOTE — Telephone Encounter (Signed)
Returned call, no answer, left vm 

## 2014-07-07 NOTE — Telephone Encounter (Signed)
Per therapist patients pain in shoulder is decreasing, therapist is asking can she take arm out of sling for ROM and if she is able to use walker yet

## 2014-07-07 NOTE — Telephone Encounter (Signed)
No follow initial orders

## 2014-07-07 NOTE — Telephone Encounter (Signed)
kendra aware

## 2014-07-07 NOTE — Telephone Encounter (Signed)
[facility] WITH [facility] IS CALLING ASKING ABOUT MOBILITY WITH [name] SHOULDER PLEASE ADVISE, YOU CAN REACH HER AT [phone]

## 2014-07-17 ENCOUNTER — Other Ambulatory Visit: Payer: Self-pay | Admitting: Family Medicine

## 2014-08-21 ENCOUNTER — Ambulatory Visit (INDEPENDENT_AMBULATORY_CARE_PROVIDER_SITE_OTHER): Payer: Self-pay | Admitting: Orthopedic Surgery

## 2014-08-21 ENCOUNTER — Ambulatory Visit (INDEPENDENT_AMBULATORY_CARE_PROVIDER_SITE_OTHER): Payer: Medicare Other

## 2014-08-21 VITALS — BP 175/76 | Ht 59.0 in | Wt 112.0 lb

## 2014-08-21 DIAGNOSIS — S4292XD Fracture of left shoulder girdle, part unspecified, subsequent encounter for fracture with routine healing: Secondary | ICD-10-CM

## 2014-08-21 NOTE — Progress Notes (Signed)
Patient ID: [name], female   DOB: 03/16/1918, 81 y.o.   MRN: [id number] Chief Complaint  Patient presents with  . Follow-up    2 month recheck + xray left shoulder fx, DOI 05/27/14    Fracture care follow-up left shoulder proximal humerus fracture that we decided to treat nonoperatively despite its angulation displacement  Patient is now pain-free. X-ray shows consolidation of the fracture with varus angulation  Patient's range of motion is limited but functional.  No therapy needed. Follow-up as needed

## 2014-08-26 DIAGNOSIS — H2512 Age-related nuclear cataract, left eye: Secondary | ICD-10-CM | POA: Diagnosis not present

## 2014-08-26 DIAGNOSIS — Z961 Presence of intraocular lens: Secondary | ICD-10-CM | POA: Diagnosis not present

## 2014-08-26 DIAGNOSIS — E119 Type 2 diabetes mellitus without complications: Secondary | ICD-10-CM | POA: Diagnosis not present

## 2014-08-26 DIAGNOSIS — H3531 Nonexudative age-related macular degeneration: Secondary | ICD-10-CM | POA: Diagnosis not present

## 2014-08-26 LAB — HM DIABETES EYE EXAM

## 2014-08-27 ENCOUNTER — Encounter: Payer: Self-pay | Admitting: *Deleted

## 2014-09-15 ENCOUNTER — Ambulatory Visit (INDEPENDENT_AMBULATORY_CARE_PROVIDER_SITE_OTHER): Payer: Medicare Other | Admitting: Family Medicine

## 2014-09-15 ENCOUNTER — Encounter: Payer: Self-pay | Admitting: Family Medicine

## 2014-09-15 VITALS — BP 151/71 | HR 73 | Temp 96.7°F | Ht 59.0 in | Wt 107.0 lb

## 2014-09-15 DIAGNOSIS — R351 Nocturia: Secondary | ICD-10-CM | POA: Diagnosis not present

## 2014-09-15 DIAGNOSIS — E119 Type 2 diabetes mellitus without complications: Secondary | ICD-10-CM | POA: Diagnosis not present

## 2014-09-15 DIAGNOSIS — I4891 Unspecified atrial fibrillation: Secondary | ICD-10-CM

## 2014-09-15 DIAGNOSIS — R8290 Unspecified abnormal findings in urine: Secondary | ICD-10-CM | POA: Diagnosis not present

## 2014-09-15 DIAGNOSIS — E559 Vitamin D deficiency, unspecified: Secondary | ICD-10-CM | POA: Diagnosis not present

## 2014-09-15 DIAGNOSIS — I10 Essential (primary) hypertension: Secondary | ICD-10-CM

## 2014-09-15 DIAGNOSIS — R5383 Other fatigue: Secondary | ICD-10-CM | POA: Diagnosis not present

## 2014-09-15 DIAGNOSIS — E785 Hyperlipidemia, unspecified: Secondary | ICD-10-CM | POA: Diagnosis not present

## 2014-09-15 DIAGNOSIS — Z79899 Other long term (current) drug therapy: Secondary | ICD-10-CM | POA: Diagnosis not present

## 2014-09-15 DIAGNOSIS — R8299 Other abnormal findings in urine: Secondary | ICD-10-CM | POA: Diagnosis not present

## 2014-09-15 LAB — POCT GLYCOSYLATED HEMOGLOBIN (HGB A1C): Hemoglobin A1C: 7

## 2014-09-15 LAB — POCT URINALYSIS DIPSTICK
Bilirubin, UA: NEGATIVE
Glucose, UA: NEGATIVE
KETONES UA: NEGATIVE
Nitrite, UA: POSITIVE
SPEC GRAV UA: 1.01
UROBILINOGEN UA: NEGATIVE
pH, UA: 7

## 2014-09-15 LAB — POCT CBC
GRANULOCYTE PERCENT: 80.5 % — AB (ref 37–80)
HCT, POC: 39.6 % (ref 37.7–47.9)
Hemoglobin: 12.3 g/dL (ref 12.2–16.2)
LYMPH, POC: 1.8 (ref 0.6–3.4)
MCH: 26.4 pg — AB (ref 27–31.2)
MCHC: 31.1 g/dL — AB (ref 31.8–35.4)
MCV: 84.8 fL (ref 80–97)
MPV: 6.5 fL (ref 0–99.8)
PLATELET COUNT, POC: 358 10*3/uL (ref 142–424)
POC Granulocyte: 8.9 — AB (ref 2–6.9)
POC LYMPH PERCENT: 16.2 %L (ref 10–50)
RBC: 4.7 M/uL (ref 4.04–5.48)
RDW, POC: 14.4 %
WBC: 11 10*3/uL — AB (ref 4.6–10.2)

## 2014-09-15 LAB — POCT UA - MICROSCOPIC ONLY
Casts, Ur, LPF, POC: NEGATIVE
Crystals, Ur, HPF, POC: NEGATIVE
YEAST UA: NEGATIVE

## 2014-09-15 MED ORDER — HYDROCORTISONE ACETATE 25 MG RE SUPP: 25.0000 mg | Freq: Two times a day (BID) | RECTAL | Status: DC

## 2014-09-15 NOTE — Progress Notes (Signed)
Subjective:    Patient ID: [name], female    DOB: 09-15-1917, 81 y.o.   MRN: [id number]  HPI Pt here for follow up and management of chronic medical problems which include diabetes, hypertension, and hyperlipidemia. She is taking her medications regularly. The patient comes today with her caregiver and her son. She continues to stay at home and her son stays with her at nighttime. The caregiver stays with her during the day. She complains of fatigue and has loose stools periodically. The patient also indicates that she gets up during the night frequently to go to the bathroom to void. She brings in blood pressures for review and these are running anywhere from 140-160/60-80. Blood sugars have been running anywhere from 106 up to 40s. The patient had a fall in October and fractured her left shoulder. This was not an operable situation. She does physical therapy at home. Her son stays with her at night.         Patient Active Problem List   Diagnosis Date Noted  . Proximal humerus fracture 05/29/2014  . Chronic insomnia 04/03/2014  . Vitamin D deficiency 04/03/2014  . Diabetes mellitus 08/19/2013  . Atrial fibrillation 08/19/2013  . High risk medication use 08/19/2013  . Hypertension 08/19/2013  . Hyperlipidemia 08/19/2013  . CLOSED FRACTURE UNSPECIFIED PART NECK FEMUR, history of 09/14/2009   Outpatient Encounter Prescriptions as of 09/15/2014  Medication Sig  . AFLURIA PRESERVATIVE FREE 0.5 ML SUSY Inject into the muscle once.  Marland Kitchen apixaban (ELIQUIS) 2.5 MG TABS tablet Take 2.5 mg by mouth 2 (two) times daily.  Marland Kitchen diltiazem (CARDIZEM CD) 240 MG 24 hr capsule Take 240 mg by mouth daily.  . metFORMIN (GLUCOPHAGE-XR) 500 MG 24 hr tablet Take 500 mg by mouth daily with breakfast.  . sertraline (ZOLOFT) 100 MG tablet TAKE 1 TABLET DAILY  . [DISCONTINUED] oxyCODONE-acetaminophen (PERCOCET) 5-325 MG per tablet Take 1-2 tablets by mouth every 4 (four) hours as needed.  . [DISCONTINUED]  diltiazem (CARDIZEM CD) 240 MG 24 hr capsule TAKE (1) CAPSULE DAILY  . [DISCONTINUED] ELIQUIS 2.5 MG TABS tablet TAKE (1) TABLET TWICE A DAY.  . [DISCONTINUED] HYDROcodone-acetaminophen (NORCO/VICODIN) 5-325 MG per tablet Take 1 tablet by mouth every 6 (six) hours as needed for moderate pain.  . [DISCONTINUED] ondansetron (ZOFRAN ODT) 4 MG disintegrating tablet 4mg  ODT q4 hours prn nausea/vomit  . [DISCONTINUED] oxyCODONE-acetaminophen (PERCOCET) 5-325 MG per tablet Take 2 tablets by mouth every 4 (four) hours as needed.  . [DISCONTINUED] promethazine (PHENERGAN) 12.5 MG tablet Take 1 tablet (12.5 mg total) by mouth every 6 (six) hours as needed for nausea or vomiting.  . [DISCONTINUED] sertraline (ZOLOFT) 100 MG tablet Take 100 mg by mouth daily.  . [DISCONTINUED] zolpidem (AMBIEN) 5 MG tablet Take 5 mg by mouth at bedtime.    Review of Systems  Constitutional: Positive for fatigue.  HENT: Negative.   Eyes: Negative.   Respiratory: Negative.   Cardiovascular: Negative.   Gastrointestinal: Positive for diarrhea (at times).  Endocrine: Negative.   Genitourinary: Negative.   Musculoskeletal: Negative.   Skin: Negative.   Allergic/Immunologic: Negative.   Neurological: Negative.   Hematological: Negative.   Psychiatric/Behavioral: Negative.        Objective:   Physical Exam  Constitutional: She is oriented to person, place, and time. She appears well-developed and well-nourished. No distress.  For her age of 34 years.  HENT:  Head: Normocephalic and atraumatic.  Nose: Nose normal.  Mouth/Throat: Oropharynx is clear and  moist.  There is some ears cerumen bilaterally  Eyes: Conjunctivae and EOM are normal. Pupils are equal, round, and reactive to light. Right eye exhibits no discharge. Left eye exhibits no discharge. No scleral icterus.  Neck: Normal range of motion. Neck supple. No thyromegaly present.  Bilateral carotid and supraclavicular bruits  Cardiovascular: Normal rate and  intact distal pulses.  Exam reveals no gallop and no friction rub.   Murmur heard. Irregular irregular at 72/m  Pulmonary/Chest: Effort normal and breath sounds normal. No respiratory distress. She has no wheezes. She has no rales. She exhibits no tenderness.  Abdominal: Soft. Bowel sounds are normal. She exhibits no mass. There is no tenderness. There is no rebound and no guarding.  There is no abdominal tenderness.  Musculoskeletal: Normal range of motion. She exhibits no edema or tenderness.  Lymphadenopathy:    She has no cervical adenopathy.  Neurological: She is alert and oriented to person, place, and time. She has normal reflexes. No cranial nerve deficit.  Skin: Skin is warm and dry. No rash noted.  Psychiatric: She has a normal mood and affect. Her behavior is normal. Judgment and thought content normal.  The patient was alert and we had a long discussion regarding ongoing care. She has fallen at home and pretty much has to have 24-hour care. Her son was present her caregiver was present in the room and discussion revolved around assisted living and the patient did not seem to be against this for future care. After this discussion it was decided that he will pursue this further and hopefully this will be an option for her so that he doesn't have to stay with her at home anymore at nighttime.  Nursing note and vitals reviewed.  BP 151/71 mmHg  Pulse 73  Temp(Src) 96.7 F (35.9 C) (Oral)  Ht $R'4\' 11"'Uw$  (1.499 m)  Wt 107 lb (48.535 kg)  BMI 21.60 kg/m2  Greater than 45 minutes of time was spent examining the patient and evaluating her and discussing options for 24-hour care at assisted living      Assessment & Plan:  1. Type 2 diabetes mellitus without complication -Blood sugars have been running in the 106-140 range at home and any change in treatment will be treated determined once the lab work is returned - POCT CBC - POCT glycosylated hemoglobin (Hb A1C)  2. Atrial  fibrillation, unspecified -Continue current treatment and blood thinner - POCT CBC - BMP8+EGFR - Hepatic function panel  3. Hyperlipidemia -The patient should continue with aggressive therapeutic lifestyle changes - POCT CBC - Lipid panel  4. Essential hypertension -The blood pressure today was slightly elevated at 151/71. No change in treatment will be recommended today. - POCT CBC - BMP8+EGFR - Hepatic function panel  5. Vitamin D deficiency -Continue current treatment - POCT CBC - Vit D  25 hydroxy (rtn osteoporosis monitoring)  6. High risk medication use -No change in treatment recommended today - POCT UA - Microscopic Only - POCT urinalysis dipstick - POCT CBC - POCT glycosylated hemoglobin (Hb A1C) - BMP8+EGFR - Lipid panel - Hepatic function panel - Vit D  25 hydroxy (rtn osteoporosis monitoring)  7. Nocturia -After a urine is evaluated, if there is infection we will call a prescription in for this. -The patient should try to elevate her feet higher than the level of her heart during the day. - POCT UA - Microscopic Only - POCT urinalysis dipstick - POCT CBC  8. Other fatigue - Thyroid Panel With  TSH  Meds ordered this encounter  Medications  . hydrocortisone (ANUSOL-HC) 25 MG suppository    Sig: Place 1 suppository (25 mg total) rectally 2 (two) times daily.    Dispense:  12 suppository    Refill:  3   Patient Instructions                       Medicare Annual Wellness Visit  [place] and the medical providers at [facility] strive to bring you the best medical care.  In doing so we not only want to address your current medical conditions and concerns but also to detect new conditions early and prevent illness, disease and health-related problems.    Medicare offers a yearly Wellness Visit which allows our clinical staff to assess your need for preventative services including immunizations, lifestyle education, counseling to  decrease risk of preventable diseases and screening for fall risk and other medical concerns.    This visit is provided free of charge (no copay) for all Medicare recipients. The clinical pharmacists at [facility] have begun to conduct these Wellness Visits which will also include a thorough review of all your medications.    As you primary medical provider recommend that you make an appointment for your Annual Wellness Visit if you have not done so already this year.  You may set up this appointment before you leave today or you may call back ([phone]) and schedule an appointment.  Please make sure when you call that you mention that you are scheduling your Annual Wellness Visit with the clinical pharmacist so that the appointment may be made for the proper length of time.     Continue current medications. Continue good therapeutic lifestyle changes which include good diet and exercise. Fall precautions discussed with patient. If an FOBT was given today- please return it to our front desk. If you are over 61 years old - you may need Prevnar 80 or the adult Pneumonia vaccine.  Flu Shots are still available at our office. If you still haven't had one please call to set up a nurse visit to get one.   After your visit with [place] today you will receive a survey in the mail or online from [employer] regarding your care with [place]. Please take a moment to fill this out. Your feedback is very important to [place] as you can help [place] better understand your patient needs as well as improve your experience and satisfaction. WE CARE ABOUT YOU!!!     Arrie Senate MD

## 2014-09-15 NOTE — Addendum Note (Signed)
Addended by: Earlene Plater on: 09/15/2014 10:36 AM   Modules accepted: Orders

## 2014-09-15 NOTE — Patient Instructions (Signed)
Medicare Annual Wellness Visit  [place] and the medical providers at [facility] strive to bring you the best medical care.  In doing so we not only want to address your current medical conditions and concerns but also to detect new conditions early and prevent illness, disease and health-related problems.    Medicare offers a yearly Wellness Visit which allows our clinical staff to assess your need for preventative services including immunizations, lifestyle education, counseling to decrease risk of preventable diseases and screening for fall risk and other medical concerns.    This visit is provided free of charge (no copay) for all Medicare recipients. The clinical pharmacists at [facility] have begun to conduct these Wellness Visits which will also include a thorough review of all your medications.    As you primary medical provider recommend that you make an appointment for your Annual Wellness Visit if you have not done so already this year.  You may set up this appointment before you leave today or you may call back ([phone]) and schedule an appointment.  Please make sure when you call that you mention that you are scheduling your Annual Wellness Visit with the clinical pharmacist so that the appointment may be made for the proper length of time.     Continue current medications. Continue good therapeutic lifestyle changes which include good diet and exercise. Fall precautions discussed with patient. If an FOBT was given today- please return it to our front desk. If you are over 50 years old - you may need Prevnar 13 or the adult Pneumonia vaccine.  Flu Shots are still available at our office. If you still haven't had one please call to set up a nurse visit to get one.   After your visit with us today you will receive a survey in the mail or online from [employer] regarding your care with us. Please take a moment to  fill this out. Your feedback is very important to us as you can help us better understand your patient needs as well as improve your experience and satisfaction. WE CARE ABOUT YOU!!!   

## 2014-09-16 ENCOUNTER — Other Ambulatory Visit: Payer: Self-pay | Admitting: *Deleted

## 2014-09-16 LAB — HEPATIC FUNCTION PANEL
ALK PHOS: 107 IU/L (ref 39–117)
ALT: 10 IU/L (ref 0–32)
AST: 15 IU/L (ref 0–40)
Albumin: 4.4 g/dL (ref 3.2–4.6)
BILIRUBIN DIRECT: 0.07 mg/dL (ref 0.00–0.40)
BILIRUBIN TOTAL: 0.3 mg/dL (ref 0.0–1.2)
Total Protein: 7.2 g/dL (ref 6.0–8.5)

## 2014-09-16 LAB — BMP8+EGFR
BUN/Creatinine Ratio: 18 (ref 11–26)
BUN: 16 mg/dL (ref 10–36)
CALCIUM: 10.1 mg/dL (ref 8.7–10.3)
CHLORIDE: 102 mmol/L (ref 97–108)
CO2: 26 mmol/L (ref 18–29)
Creatinine, Ser: 0.91 mg/dL (ref 0.57–1.00)
GFR calc Af Amer: 62 mL/min/{1.73_m2} (ref >59)
GFR, EST NON AFRICAN AMERICAN: 54 mL/min/{1.73_m2} — AB (ref >59)
Glucose: 155 mg/dL — ABNORMAL HIGH (ref 65–99)
POTASSIUM: 5 mmol/L (ref 3.5–5.2)
Sodium: 143 mmol/L (ref 134–144)

## 2014-09-16 LAB — LIPID PANEL
CHOLESTEROL TOTAL: 221 mg/dL — AB (ref 100–199)
Chol/HDL Ratio: 3.6 ratio units (ref 0.0–4.4)
HDL: 62 mg/dL (ref >39)
LDL Calculated: 125 mg/dL — ABNORMAL HIGH (ref 0–99)
Triglycerides: 172 mg/dL — ABNORMAL HIGH (ref 0–149)
VLDL CHOLESTEROL CAL: 34 mg/dL (ref 5–40)

## 2014-09-16 LAB — THYROID PANEL WITH TSH
Free Thyroxine Index: 2.4 (ref 1.2–4.9)
T3 Uptake Ratio: 31 % (ref 24–39)
T4, Total: 7.7 ug/dL (ref 4.5–12.0)
TSH: 3.03 u[IU]/mL (ref 0.450–4.500)

## 2014-09-16 LAB — VITAMIN D 25 HYDROXY (VIT D DEFICIENCY, FRACTURES): Vit D, 25-Hydroxy: 11.5 ng/mL — ABNORMAL LOW (ref 30.0–100.0)

## 2014-09-16 MED ORDER — CIPROFLOXACIN HCL 250 MG PO TABS: 250.0000 mg | ORAL_TABLET | Freq: Two times a day (BID) | ORAL | Status: DC

## 2014-09-17 LAB — URINE CULTURE

## 2014-09-18 ENCOUNTER — Telehealth: Payer: Self-pay | Admitting: Family Medicine

## 2014-09-18 ENCOUNTER — Other Ambulatory Visit: Payer: Self-pay | Admitting: *Deleted

## 2014-09-18 MED ORDER — AMPICILLIN 500 MG PO CAPS: 500.0000 mg | ORAL_CAPSULE | Freq: Four times a day (QID) | ORAL | Status: DC

## 2014-09-18 NOTE — Telephone Encounter (Signed)
Spoke with pt's son about antibiotic He is going to pt up the RX on Friday Verbalizes understanding

## 2014-10-02 ENCOUNTER — Other Ambulatory Visit (INDEPENDENT_AMBULATORY_CARE_PROVIDER_SITE_OTHER): Payer: Medicare Other

## 2014-10-02 DIAGNOSIS — N390 Urinary tract infection, site not specified: Secondary | ICD-10-CM

## 2014-10-02 LAB — POCT URINALYSIS DIPSTICK
BILIRUBIN UA: NEGATIVE
Blood, UA: NEGATIVE
Glucose, UA: NEGATIVE
Ketones, UA: NEGATIVE
LEUKOCYTES UA: NEGATIVE
Nitrite, UA: NEGATIVE
PH UA: 6.5
PROTEIN UA: NEGATIVE
Spec Grav, UA: 1.015
Urobilinogen, UA: NEGATIVE

## 2014-10-02 LAB — POCT UA - MICROSCOPIC ONLY
BACTERIA, U MICROSCOPIC: NEGATIVE
CASTS, UR, LPF, POC: NEGATIVE
Crystals, Ur, HPF, POC: NEGATIVE
MUCUS UA: NEGATIVE
RBC, URINE, MICROSCOPIC: NEGATIVE
WBC, Ur, HPF, POC: NEGATIVE
Yeast, UA: NEGATIVE

## 2014-10-02 NOTE — Progress Notes (Signed)
Lab only 

## 2014-10-06 ENCOUNTER — Other Ambulatory Visit: Payer: Self-pay | Admitting: Family Medicine

## 2014-10-21 ENCOUNTER — Encounter: Payer: Self-pay | Admitting: *Deleted

## 2014-10-24 ENCOUNTER — Other Ambulatory Visit: Payer: Self-pay | Admitting: Family Medicine

## 2014-11-18 ENCOUNTER — Ambulatory Visit (INDEPENDENT_AMBULATORY_CARE_PROVIDER_SITE_OTHER): Payer: Medicare Other | Admitting: Family Medicine

## 2014-11-18 ENCOUNTER — Encounter: Payer: Self-pay | Admitting: Family Medicine

## 2014-11-18 ENCOUNTER — Ambulatory Visit (INDEPENDENT_AMBULATORY_CARE_PROVIDER_SITE_OTHER): Payer: Medicare Other

## 2014-11-18 VITALS — BP 199/65 | HR 57 | Temp 97.8°F | Ht 59.0 in

## 2014-11-18 DIAGNOSIS — R0789 Other chest pain: Secondary | ICD-10-CM | POA: Diagnosis not present

## 2014-11-18 NOTE — Progress Notes (Signed)
   Subjective:    Patient ID: [name], female    DOB: 11-Jan-1918, 81 y.o.   MRN: [id number]  HPI 81 year old female who has vague discomfort on the right side of her chest starts at sternal and seems to radiate around to the back. She has a history of compression fracture, falls, and fractured shoulders. She had a shingles shot in the past and has seen no rash in the area. There does not seem to be a pleuritic component.  Patient Active Problem List   Diagnosis Date Noted  . Proximal humerus fracture 05/29/2014  . Chronic insomnia 04/03/2014  . Vitamin D deficiency 04/03/2014  . Diabetes mellitus 08/19/2013  . Atrial fibrillation 08/19/2013  . High risk medication use 08/19/2013  . Hypertension 08/19/2013  . Hyperlipidemia 08/19/2013  . CLOSED FRACTURE UNSPECIFIED PART NECK FEMUR, history of 09/14/2009   Outpatient Encounter Prescriptions as of 11/18/2014  Medication Sig  . AFLURIA PRESERVATIVE FREE 0.5 ML SUSY Inject into the muscle once.  . diltiazem (CARDIZEM CD) 240 MG 24 hr capsule TAKE (1) CAPSULE DAILY  . ELIQUIS 2.5 MG TABS tablet TAKE (1) TABLET TWICE A DAY.  . hydrocortisone (ANUSOL-HC) 25 MG suppository Place 1 suppository (25 mg total) rectally 2 (two) times daily.  . metFORMIN (GLUCOPHAGE-XR) 500 MG 24 hr tablet TAKE 1 TABLET ONCE DAILY WITH LARGEST MEAL.  Marland Kitchen sertraline (ZOLOFT) 100 MG tablet TAKE 1 TABLET DAILY  . [DISCONTINUED] apixaban (ELIQUIS) 2.5 MG TABS tablet Take 2.5 mg by mouth 2 (two) times daily.  . [DISCONTINUED] diltiazem (CARDIZEM CD) 240 MG 24 hr capsule Take 240 mg by mouth daily.  . [DISCONTINUED] ampicillin (PRINCIPEN) 500 MG capsule Take 1 capsule (500 mg total) by mouth 4 (four) times daily.  . [DISCONTINUED] metFORMIN (GLUCOPHAGE-XR) 500 MG 24 hr tablet Take 500 mg by mouth daily with breakfast.     Review of Systems  Constitutional: Negative.   HENT: Negative.   Respiratory: Negative.   Cardiovascular: Positive for chest pain.    Gastrointestinal: Negative.   Neurological: Negative.   Psychiatric/Behavioral: Negative.        Objective:   Physical Exam  Constitutional: She appears well-developed and well-nourished.  Pulmonary/Chest: She exhibits tenderness.  Skin:  No rash as with zoster    BP 199/65 mmHg  Pulse 57  Temp(Src) 97.8 F (36.6 C) (Oral)  Ht 4\' 11"  (1.499 m)  Wt  Pulse Ox: 98       Assessment & Plan:  1. Other chest pain Chest x-ray shows multiple levels compression fracture in which cement has been injected. There are some other compressions, age is indeterminate. This may be the source of her symptoms. Other possibility would be early zoster without the rash. This was explained to her. As long as Tylenol will treat the pain I would advise her to use that. She does have hydrocodone to use for pain unrelieved by Tylenol. Activities as tolerated  Wardell Honour MD - DG Chest 2 View; Future

## 2014-12-01 ENCOUNTER — Ambulatory Visit (HOSPITAL_COMMUNITY)
Admission: RE | Admit: 2014-12-01 | Discharge: 2014-12-01 | Disposition: A | Discharge status: Home / Self Care | Payer: Medicare Other | Source: Ambulatory Visit | Attending: Interventional Radiology | Admitting: Interventional Radiology

## 2014-12-01 ENCOUNTER — Other Ambulatory Visit (HOSPITAL_COMMUNITY): Payer: Self-pay | Admitting: Interventional Radiology

## 2014-12-01 DIAGNOSIS — M549 Dorsalgia, unspecified: Secondary | ICD-10-CM

## 2014-12-01 DIAGNOSIS — IMO0002 Reserved for concepts with insufficient information to code with codable children: Secondary | ICD-10-CM

## 2014-12-01 DIAGNOSIS — S22059A Unspecified fracture of T5-T6 vertebra, initial encounter for closed fracture: Secondary | ICD-10-CM | POA: Diagnosis not present

## 2014-12-02 ENCOUNTER — Other Ambulatory Visit (HOSPITAL_COMMUNITY): Payer: Self-pay | Admitting: Interventional Radiology

## 2014-12-02 DIAGNOSIS — IMO0002 Reserved for concepts with insufficient information to code with codable children: Secondary | ICD-10-CM

## 2014-12-02 DIAGNOSIS — M549 Dorsalgia, unspecified: Secondary | ICD-10-CM

## 2014-12-03 ENCOUNTER — Other Ambulatory Visit: Payer: Self-pay | Admitting: Radiology

## 2014-12-04 ENCOUNTER — Other Ambulatory Visit: Payer: Self-pay | Admitting: Radiology

## 2014-12-05 ENCOUNTER — Other Ambulatory Visit (HOSPITAL_COMMUNITY): Payer: Self-pay | Admitting: Interventional Radiology

## 2014-12-05 ENCOUNTER — Encounter (HOSPITAL_COMMUNITY): Payer: Self-pay

## 2014-12-05 ENCOUNTER — Ambulatory Visit (HOSPITAL_COMMUNITY)
Admission: RE | Admit: 2014-12-05 | Discharge: 2014-12-05 | Disposition: A | Discharge status: Home / Self Care | Payer: Medicare Other | Source: Ambulatory Visit | Attending: Interventional Radiology | Admitting: Interventional Radiology

## 2014-12-05 DIAGNOSIS — Z79899 Other long term (current) drug therapy: Secondary | ICD-10-CM | POA: Insufficient documentation

## 2014-12-05 DIAGNOSIS — IMO0002 Reserved for concepts with insufficient information to code with codable children: Secondary | ICD-10-CM

## 2014-12-05 DIAGNOSIS — M4854XA Collapsed vertebra, not elsewhere classified, thoracic region, initial encounter for fracture: Secondary | ICD-10-CM | POA: Diagnosis not present

## 2014-12-05 DIAGNOSIS — M549 Dorsalgia, unspecified: Secondary | ICD-10-CM

## 2014-12-05 DIAGNOSIS — S22040A Wedge compression fracture of fourth thoracic vertebra, initial encounter for closed fracture: Secondary | ICD-10-CM | POA: Diagnosis not present

## 2014-12-05 DIAGNOSIS — S22060A Wedge compression fracture of T7-T8 vertebra, initial encounter for closed fracture: Secondary | ICD-10-CM | POA: Insufficient documentation

## 2014-12-05 DIAGNOSIS — I4891 Unspecified atrial fibrillation: Secondary | ICD-10-CM | POA: Diagnosis not present

## 2014-12-05 DIAGNOSIS — M545 Low back pain: Secondary | ICD-10-CM | POA: Diagnosis not present

## 2014-12-05 DIAGNOSIS — I10 Essential (primary) hypertension: Secondary | ICD-10-CM | POA: Diagnosis not present

## 2014-12-05 DIAGNOSIS — E119 Type 2 diabetes mellitus without complications: Secondary | ICD-10-CM | POA: Diagnosis not present

## 2014-12-05 DIAGNOSIS — Z85828 Personal history of other malignant neoplasm of skin: Secondary | ICD-10-CM | POA: Diagnosis not present

## 2014-12-05 LAB — CBC WITH DIFFERENTIAL/PLATELET
Basophils Absolute: 0 10*3/uL (ref 0.0–0.1)
Basophils Relative: 0 % (ref 0–1)
Eosinophils Absolute: 0 10*3/uL (ref 0.0–0.7)
Eosinophils Relative: 0 % (ref 0–5)
HEMATOCRIT: 34 % — AB (ref 36.0–46.0)
HEMOGLOBIN: 11.5 g/dL — AB (ref 12.0–15.0)
LYMPHS PCT: 15 % (ref 12–46)
Lymphs Abs: 1.6 10*3/uL (ref 0.7–4.0)
MCH: 28.8 pg (ref 26.0–34.0)
MCHC: 33.8 g/dL (ref 30.0–36.0)
MCV: 85 fL (ref 78.0–100.0)
MONO ABS: 0.5 10*3/uL (ref 0.1–1.0)
MONOS PCT: 5 % (ref 3–12)
NEUTROS ABS: 8.4 10*3/uL — AB (ref 1.7–7.7)
Neutrophils Relative %: 80 % — ABNORMAL HIGH (ref 43–77)
Platelets: 276 10*3/uL (ref 150–400)
RBC: 4 MIL/uL (ref 3.87–5.11)
RDW: 13.4 % (ref 11.5–15.5)
WBC: 10.6 10*3/uL — AB (ref 4.0–10.5)

## 2014-12-05 LAB — PROTIME-INR
INR: 1.03 (ref 0.00–1.49)
PROTHROMBIN TIME: 13.6 s (ref 11.6–15.2)

## 2014-12-05 LAB — GLUCOSE, CAPILLARY: GLUCOSE-CAPILLARY: 84 mg/dL (ref 70–99)

## 2014-12-05 MED ORDER — CEFAZOLIN SODIUM-DEXTROSE 2-3 GM-% IV SOLR
2.0000 g | Freq: Once | INTRAVENOUS | Status: AC
  Administered 2014-12-05: 2 g via INTRAVENOUS

## 2014-12-05 MED ORDER — SODIUM CHLORIDE 0.9 % IV SOLN
INTRAVENOUS | Status: DC
  Administered 2014-12-05: 14:00:00 via INTRAVENOUS

## 2014-12-05 MED ORDER — IOHEXOL 300 MG/ML  SOLN
50.0000 mL | Freq: Once | INTRAMUSCULAR | Status: AC | PRN
  Administered 2014-12-05: 1 mL via INTRAVENOUS

## 2014-12-05 MED ORDER — CEFAZOLIN SODIUM-DEXTROSE 2-3 GM-% IV SOLR
INTRAVENOUS | Status: AC
  Filled 2014-12-05: qty 50

## 2014-12-05 MED ORDER — BUPIVACAINE HCL (PF) 0.25 % IJ SOLN
INTRAMUSCULAR | Status: DC
Start: 2014-12-05 — End: 2014-12-06
  Filled 2014-12-05: qty 30

## 2014-12-05 MED ORDER — ACETAMINOPHEN 325 MG PO TABS
ORAL_TABLET | ORAL | Status: AC
  Filled 2014-12-05: qty 2

## 2014-12-05 MED ORDER — MIDAZOLAM HCL 2 MG/2ML IJ SOLN
INTRAMUSCULAR | Status: AC | PRN
  Administered 2014-12-05 (×2): 0.5 mg via INTRAVENOUS

## 2014-12-05 MED ORDER — FENTANYL CITRATE (PF) 100 MCG/2ML IJ SOLN
INTRAMUSCULAR | Status: AC
  Filled 2014-12-05: qty 4

## 2014-12-05 MED ORDER — ACETAMINOPHEN 325 MG PO TABS
650.0000 mg | ORAL_TABLET | Freq: Once | ORAL | Status: AC
  Administered 2014-12-05: 650 mg via ORAL

## 2014-12-05 MED ORDER — MIDAZOLAM HCL 2 MG/2ML IJ SOLN
INTRAMUSCULAR | Status: AC
  Filled 2014-12-05: qty 4

## 2014-12-05 MED ORDER — FENTANYL CITRATE (PF) 100 MCG/2ML IJ SOLN
INTRAMUSCULAR | Status: AC | PRN
  Administered 2014-12-05: 12.5 ug via INTRAVENOUS
  Administered 2014-12-05: 25 ug via INTRAVENOUS

## 2014-12-05 NOTE — H&P (Signed)
Chief Complaint: Back pain Worsening x 3 weeks  Referring Physician(s): Dr Alain Honey  History of Present Illness: [name] is a 81 y.o. female   Pt well known to IR service  Has had previous vertebral augmentation Thoracic 7; 9 and 12 Also Lumbar 2 and 3 Now has acute fx and worsening back pain  "8-10" on scale of 10 Using Tylenol to ease pain; but Hydrocodone helps most Acute Thoracic 6 fracture on MRI 12/01/14 Pt denies recent injury---she did fall and break shoulder in October 2015--may be residual from that Request for Thoracic 6 vertebroplasty/kyphoplasty   Past Medical History  Diagnosis Date  . Diabetes mellitus   . Hypertension   . Fatigue   . Atrial fibrillation   . Basal cell cancer     right shoulder  . Kyphosis     Past Surgical History  Procedure Laterality Date  . Hip fracture surgery Bilateral   . Bone biopsy      x 5  . Eye surgery Right     Allergies: Ace inhibitors; Codeine; and Vytorin  Medications: Prior to Admission medications   Medication Sig Start Date End Date Taking? Authorizing Provider  diltiazem (CARDIZEM CD) 240 MG 24 hr capsule TAKE (1) CAPSULE DAILY 10/07/14  Yes Chipper Herb, MD  ELIQUIS 2.5 MG TABS tablet TAKE (1) TABLET TWICE A DAY. 10/24/14  Yes Chipper Herb, MD  metFORMIN (GLUCOPHAGE-XR) 500 MG 24 hr tablet TAKE 1 TABLET ONCE DAILY WITH LARGEST MEAL. 10/07/14  Yes Chipper Herb, MD  sertraline (ZOLOFT) 100 MG tablet TAKE 1 TABLET DAILY 10/07/14  Yes Chipper Herb, MD  hydrocortisone (ANUSOL-HC) 25 MG suppository Place 1 suppository (25 mg total) rectally 2 (two) times daily. Patient not taking: Reported on 12/03/2014 09/15/14   Chipper Herb, MD     Family History  Problem Relation Age of Onset  . Diabetes Mother   . Heart disease Mother     History   Social History  . Marital Status: Widowed    Spouse Name: N/A  . Number of Children: N/A  . Years of Education: N/A   Social History Main Topics   . Smoking status: Never Smoker   . Smokeless tobacco: Never Used  . Alcohol Use: No  . Drug Use: No  . Sexual Activity: Not on file   Other Topics Concern  . None   Social History Narrative      Review of Systems: A 12 point ROS discussed and pertinent positives are indicated in the HPI above.  All other systems are negative.  Review of Systems  Constitutional: Positive for activity change and appetite change. Negative for fever.  HENT: Positive for hearing loss.   Respiratory: Negative for shortness of breath.   Gastrointestinal: Negative for abdominal pain.  Musculoskeletal: Positive for back pain and gait problem.  Neurological: Positive for weakness.  Psychiatric/Behavioral: Negative for behavioral problems and confusion.    Vital Signs: BP 180/117 mmHg  Pulse 57  Temp(Src) 98.2 F (36.8 C)  Resp 20  Ht 5' (1.524 m)  Wt 48.988 kg (108 lb)  BMI 21.09 kg/m2  Physical Exam  Constitutional: She is oriented to person, place, and time. She appears well-developed.  Cardiovascular:  Murmur heard. Irreg rate AO stenosis  Pulmonary/Chest: Effort normal and breath sounds normal. She has no wheezes.  Abdominal: Soft. Bowel sounds are normal. There is no tenderness.  Musculoskeletal: Normal range of motion. She exhibits tenderness.  Upper back  tenderness  Neurological: She is alert and oriented to person, place, and time.  Skin: Skin is warm and dry.  Psychiatric: She has a normal mood and affect. Her behavior is normal. Judgment and thought content normal.  Nursing note and vitals reviewed.   Mallampati Score:  MD Evaluation Airway: WNL Heart: WNL Abdomen: WNL Chest/ Lungs: WNL ASA  Classification: 3 Mallampati/Airway Score: Two  Imaging: Dg Chest 2 View  11/18/2014   CLINICAL DATA:  Chest pain  EXAM: CHEST  2 VIEW  COMPARISON:  None.  FINDINGS: The lungs are clear but hyper inflated with increased AP diameter consistent with a degree of emphysema. No  infiltrate or effusion is seen. Mediastinal and hilar contours are unremarkable. The heart is mildly enlarged. The bones are osteopenic. Multilevel vertebroplasties are present, in the region of T7, T9 T9, T12, L2, and L3.  IMPRESSION: 1. Probable emphysema.  No active infiltrate or effusion. 2. Multilevel vertebroplasties.   Electronically Signed   By: Ivar Drape M.D.   On: 11/18/2014 16:49   Mr Thoracic Spine Wo Contrast  12/01/2014   CLINICAL DATA:  Severe back pain.  History of multiple fractures.  EXAM: MRI THORACIC SPINE WITHOUT CONTRAST  TECHNIQUE: Multiplanar, multisequence MR imaging of the thoracic spine was performed. No intravenous contrast was administered.  COMPARISON:  Thoracic MRI 04/15/2008  FINDINGS: Acute/subacute fracture T6 vertebral body with mild loss of height. Diffuse bone marrow edema in the vertebral body. No retropulsion into the canal and no cord compression or spinal stenosis is identified. No other acute fracture  Chronic fractures with cement vertebroplasty involving T7, T9, T12, L2, and L3  Spinal cord signal is normal. No cord compression or cord lesion. Mild thoracic disc degeneration at multiple levels without disc protrusion.  IMPRESSION: Mild acute/subacute fracture of T6 without spinal stenosis  Multiple chronic compression fractures and cement vertebroplasty as above.   Electronically Signed   By: Franchot Gallo M.D.   On: 12/01/2014 16:33    Labs:  CBC:  Recent Labs  04/03/14 1032 09/15/14 1102 12/05/14 1348  WBC 9.4 11.0* 10.6*  HGB 12.0* 12.3 11.5*  HCT 35.5* 39.6 34.0*  PLT  --   --  276    COAGS: No results for input(s): INR, APTT in the last 8760 hours.  BMP:  Recent Labs  04/03/14 0944 09/15/14 1017  NA 143 143  K 5.2 5.0  CL 102 102  CO2 26 26  GLUCOSE 153* 155*  BUN 21 16  CALCIUM 10.2 10.1  CREATININE 1.05* 0.91  GFRNONAA 46* 54*  GFRAA 53* 62    LIVER FUNCTION TESTS:  Recent Labs  04/03/14 0944 09/15/14 1017  BILITOT  0.2 0.3  AST 21 15  ALT 10 10  ALKPHOS 96 107  PROT 7.2 7.2    TUMOR MARKERS: No results for input(s): AFPTM, CEA, CA199, CHROMGRNA in the last 8760 hours.  Assessment and Plan:  Worsening back pain x 3 weeks Hydrocodone relieves pain well MRI reveals acute fx T6 Now scheduled for T6 VP/KP with Dr Laurence Ferrari Risks and Benefits discussed with the patient and family including, but not limited to education regarding the natural healing process of compression fractures without intervention, bleeding, infection, cement migration which may cause spinal cord damage, paralysis, pulmonary embolism or even death. All of the patient's questions were answered, patient and family agreeable to proceed. Consent signed and in chart.     Thank you for this interesting consult.  I greatly enjoyed meeting [name] and look forward to participating in their care.  Signed: Shanyce Daris A 12/05/2014, 2:10 PM   I spent a total of  20 Minutes   in face to face in clinical consultation, greater than 50% of which was counseling/coordinating care for KP/VP T6

## 2014-12-05 NOTE — Sedation Documentation (Signed)
Patient is resting comfortably. 

## 2014-12-05 NOTE — Sedation Documentation (Signed)
Patient grimacing, states she is in pain

## 2014-12-05 NOTE — Procedures (Signed)
Interventional Radiology Procedure Note  Procedure: T6 vertebroplasty  Complications: None  Estimated Blood Loss: 0  Recommendations: - Bedrest 3 hours - DC home with caregiver  Signed,  Criselda Peaches, MD

## 2014-12-05 NOTE — Discharge Instructions (Signed)
Percutaneous Vertebroplasty, Care After °These instructions give you information on caring for yourself after your procedure. Your doctor may also give you more specific instructions. Call your doctor if you have any problems or questions after your procedure. °HOME CARE °· Take medicine as told by your doctor. °· Keep your wound dry and covered for 24 hours or as told by your doctor. °· Ask your doctor when you can bathe or shower. °· Put an ice pack on your wound. °¨ Put ice in a plastic bag. °¨ Place a towel between your skin and the bag. °¨ Leave the ice on for 15-20 minutes, 3-4 times a day. °· Rest in your bed for 24 hours or as told by your doctor. °· Return to normal activities as told by your doctor. °· Ask your doctor what stretches and exercises you can do. °· Do not bend or lift anything heavy as told by your doctor. °GET HELP IF: °· Your wound becomes red, puffy (swollen), or tender to the touch. °· You are bleeding or leaking fluid from the wound. °· You are sick to your stomach (nauseous) or throw up (vomit) for more than 24 hours after the procedure. °· Your back pain does not get better. °· You have a fever. °GET HELP RIGHT AWAY IF:  °· You have bad back pain that comes on suddenly. °· You cannot control when you pee (urinate) or poop (bowel movement). °· You lose feeling (numbness) or have tingling in your legs or feet, or they become weak. °· You have sudden weakness in your arms or legs. °· You have shooting pain down your legs. °· You have chest pain or a hard time breathing. °· You feel dizzy or pass out (faint). °· Your vision changes or you cannot talk as you normally do. °Document Released: 10/26/2009 Document Revised: 08/06/2013 Document Reviewed: 04/09/2013 °Exit[employer]® Patient Information ©2015 [employer]. This information is not intended to replace advice given to you by your health care provider. Make sure you discuss any questions you have with your health care provider. ° °

## 2014-12-09 ENCOUNTER — Ambulatory Visit (HOSPITAL_COMMUNITY): Admission: RE | Admit: 2014-12-09 | Payer: Medicare Other | Source: Ambulatory Visit

## 2015-01-06 ENCOUNTER — Other Ambulatory Visit: Payer: Self-pay | Admitting: Family Medicine

## 2015-01-27 ENCOUNTER — Ambulatory Visit: Payer: Medicare Other | Admitting: Family Medicine

## 2015-01-27 ENCOUNTER — Other Ambulatory Visit: Payer: Self-pay | Admitting: Family Medicine

## 2015-03-09 ENCOUNTER — Other Ambulatory Visit: Payer: Self-pay | Admitting: Family Medicine

## 2015-03-19 ENCOUNTER — Ambulatory Visit: Payer: Medicare Other | Admitting: Family Medicine

## 2015-03-20 ENCOUNTER — Ambulatory Visit (INDEPENDENT_AMBULATORY_CARE_PROVIDER_SITE_OTHER): Payer: Medicare Other | Admitting: Family Medicine

## 2015-03-20 ENCOUNTER — Encounter: Payer: Self-pay | Admitting: Family Medicine

## 2015-03-20 VITALS — BP 189/99 | HR 78 | Temp 97.1°F | Ht 60.0 in | Wt 108.0 lb

## 2015-03-20 DIAGNOSIS — I63312 Cerebral infarction due to thrombosis of left middle cerebral artery: Secondary | ICD-10-CM

## 2015-03-20 DIAGNOSIS — E785 Hyperlipidemia, unspecified: Secondary | ICD-10-CM | POA: Diagnosis not present

## 2015-03-20 DIAGNOSIS — H6120 Impacted cerumen, unspecified ear: Secondary | ICD-10-CM

## 2015-03-20 DIAGNOSIS — R29898 Other symptoms and signs involving the musculoskeletal system: Secondary | ICD-10-CM

## 2015-03-20 DIAGNOSIS — E559 Vitamin D deficiency, unspecified: Secondary | ICD-10-CM | POA: Diagnosis not present

## 2015-03-20 DIAGNOSIS — H918X9 Other specified hearing loss, unspecified ear: Secondary | ICD-10-CM | POA: Diagnosis not present

## 2015-03-20 DIAGNOSIS — E119 Type 2 diabetes mellitus without complications: Secondary | ICD-10-CM | POA: Diagnosis not present

## 2015-03-20 DIAGNOSIS — I10 Essential (primary) hypertension: Secondary | ICD-10-CM | POA: Diagnosis not present

## 2015-03-20 DIAGNOSIS — I4891 Unspecified atrial fibrillation: Secondary | ICD-10-CM | POA: Diagnosis not present

## 2015-03-20 DIAGNOSIS — H6123 Impacted cerumen, bilateral: Secondary | ICD-10-CM

## 2015-03-20 LAB — POCT CBC
GRANULOCYTE PERCENT: 78.8 % (ref 37–80)
HEMATOCRIT: 37.9 % (ref 37.7–47.9)
Hemoglobin: 11.9 g/dL — AB (ref 12.2–16.2)
Lymph, poc: 1.8 (ref 0.6–3.4)
MCH, POC: 26.8 pg — AB (ref 27–31.2)
MCHC: 31.5 g/dL — AB (ref 31.8–35.4)
MCV: 85.2 fL (ref 80–97)
MPV: 6.3 fL (ref 0–99.8)
PLATELET COUNT, POC: 405 10*3/uL (ref 142–424)
POC GRANULOCYTE: 7.4 — AB (ref 2–6.9)
POC LYMPH PERCENT: 19.5 %L (ref 10–50)
RBC: 4.45 M/uL (ref 4.04–5.48)
RDW, POC: 13.6 %
WBC: 9.4 10*3/uL (ref 4.6–10.2)

## 2015-03-20 LAB — POCT GLYCOSYLATED HEMOGLOBIN (HGB A1C): Hemoglobin A1C: 6.9

## 2015-03-20 MED ORDER — DILTIAZEM HCL ER COATED BEADS 240 MG PO CP24: ORAL_CAPSULE | ORAL | Status: DC

## 2015-03-20 MED ORDER — APIXABAN 2.5 MG PO TABS: ORAL_TABLET | ORAL | Status: DC

## 2015-03-20 MED ORDER — SERTRALINE HCL 100 MG PO TABS: 100.0000 mg | ORAL_TABLET | Freq: Every day | ORAL | Status: DC

## 2015-03-20 MED ORDER — METFORMIN HCL ER 500 MG PO TB24: ORAL_TABLET | ORAL | Status: DC

## 2015-03-20 NOTE — Patient Instructions (Addendum)
Medicare Annual Wellness Visit  [facility] and the medical providers at [facility] strive to bring you the best medical care.  In doing so we not only want to address your current medical conditions and concerns but also to detect new conditions early and prevent illness, disease and health-related problems.    Medicare offers a yearly Wellness Visit which allows our clinical staff to assess your need for preventative services including immunizations, lifestyle education, counseling to decrease risk of preventable diseases and screening for fall risk and other medical concerns.    This visit is provided free of charge (no copay) for all Medicare recipients. The clinical pharmacists at [facility] have begun to conduct these Wellness Visits which will also include a thorough review of all your medications.    As you primary medical provider recommend that you make an appointment for your Annual Wellness Visit if you have not done so already this year.  You may set up this appointment before you leave today or you may call back ([phone]) and schedule an appointment.  Please make sure when you call that you mention that you are scheduling your Annual Wellness Visit with the clinical pharmacist so that the appointment may be made for the proper length of time.     Continue current medications. Continue good therapeutic lifestyle changes which include good diet and exercise. Fall precautions discussed with patient. If an FOBT was given today- please return it to our front desk. If you are over 39 years old - you may need Prevnar 2 or the adult Pneumonia vaccine.   After your visit with [place] today you will receive a survey in the mail or online from [employer] regarding your care with [place]. Please take a moment to fill this out. Your feedback is very important to [place] as you can help [place] better understand your patient needs as well as  improve your experience and satisfaction. WE CARE ABOUT YOU!!!   The patient should continue to use her walker regularly and moves slowly and be careful not to fall She should continue with her current treatment and check her blood sugars and feet regularly. She should continue to drink plenty of fluids The CBC done today and hemoglobin A 1C were both good and within normal limits for this patient. There are other agents which can be used to improve the saliva in the mouth. One of them is called Oasis mouthwash or spray. Another one is call saliva sure lozenges--- these can be tried instead of the Biotene to see if this helps the dryness in her mouth. Also she is currently taking 100 mg of Zoloft and we might consider trying to reduce this down to 50 as this could certainly contribute to her dry mouth situation also

## 2015-03-20 NOTE — Progress Notes (Signed)
Subjective:    Patient ID: [name], female    DOB: 02-Nov-1917, 81 y.o.   MRN: [id number]  HPI Pt here for follow up and management of chronic medical problems which includes diabetes and hypertension. She is taking medications regularly. As usual today, she has no complaints. She denies chest pain shortness of breath heartburn trouble swallowing nausea vomiting diarrhea or blood in the stool. She also is passing her water but uses a depends. She is passing her water without problems since is incontinent. She comes to the visit today with her caregiver and her son. Home blood pressures and blood sugars were reviewed and these will be scanned into the record.        Patient Active Problem List   Diagnosis Date Noted  . Compression fracture   . Proximal humerus fracture 05/29/2014  . Chronic insomnia 04/03/2014  . Vitamin D deficiency 04/03/2014  . Diabetes mellitus 08/19/2013  . Atrial fibrillation 08/19/2013  . High risk medication use 08/19/2013  . Hypertension 08/19/2013  . Hyperlipidemia 08/19/2013  . CLOSED FRACTURE UNSPECIFIED PART NECK FEMUR, history of 09/14/2009   Outpatient Encounter Prescriptions as of 03/20/2015  Medication Sig  . diltiazem (CARDIZEM CD) 240 MG 24 hr capsule TAKE (1) CAPSULE DAILY  . ELIQUIS 2.5 MG TABS tablet TAKE (1) TABLET TWICE A DAY.  . metFORMIN (GLUCOPHAGE-XR) 500 MG 24 hr tablet TAKE 1 TABLET ONCE DAILY WITH LARGEST MEAL.  Marland Kitchen ONE TOUCH ULTRA TEST test strip USE TO CHECK BLOOD SUGARS EVERY DAY  . sertraline (ZOLOFT) 100 MG tablet TAKE 1 TABLET DAILY   No facility-administered encounter medications on file as of 03/20/2015.     Review of Systems  Constitutional: Negative.   HENT: Negative.   Eyes: Negative.   Respiratory: Negative.   Cardiovascular: Negative.   Gastrointestinal: Negative.   Endocrine: Negative.   Genitourinary: Negative.   Musculoskeletal: Negative.   Skin: Negative.   Allergic/Immunologic: Negative.     Neurological: Negative.   Hematological: Negative.   Psychiatric/Behavioral: Negative.        Objective:   Physical Exam  Constitutional: She is oriented to person, place, and time. She appears well-developed and well-nourished. No distress.  The patient is alert for her age and very with it.  HENT:  Head: Normocephalic and atraumatic.  Nose: Nose normal.  Mouth/Throat: Oropharynx is clear and moist.  There is ears cerumen bilaterally and we will attempt irrigating this out as she has been using Debrox eardrops to soften the wax.  Eyes: Conjunctivae and EOM are normal. Pupils are equal, round, and reactive to light. Right eye exhibits no discharge. Left eye exhibits no discharge. No scleral icterus.  Neck: Normal range of motion. Neck supple. No thyromegaly present.  Neck without adenopathy or thyromegaly  Cardiovascular: Normal rate and intact distal pulses.  Exam reveals no gallop and no friction rub.   Murmur heard. There is a systolic ejection murmur that is audible also in the carotids. The heart is irregular irregular at 72/m  Pulmonary/Chest: Effort normal and breath sounds normal. No respiratory distress. She has no wheezes. She has no rales. She exhibits no tenderness.  Clear anteriorly and posteriorly  Abdominal: Soft. Bowel sounds are normal. She exhibits no mass. There is no tenderness. There is no rebound and no guarding.  No abdominal bruits or tenderness  Musculoskeletal: She exhibits edema. She exhibits no tenderness.  There is slight edema in both the left greater than right The patient is somewhat kyphotic and  does use a walker for ambulation  Lymphadenopathy:    She has no cervical adenopathy.  Neurological: She is alert and oriented to person, place, and time.  Skin: Skin is warm and dry. No rash noted.  Psychiatric: She has a normal mood and affect. Her behavior is normal. Judgment and thought content normal.  Nursing note and vitals reviewed.  BP 189/99 mmHg   Pulse 78  Temp(Src) 97.1 F (36.2 C) (Oral)  Ht 5' (1.524 m)  Wt 108 lb (48.988 kg)  BMI 21.09 kg/m2  Home blood pressures were reviewed as well as home blood sugars and these are much better than in the office. This record will be scanned into the chart. Results for orders placed or performed in visit on 03/20/15  POCT CBC  Result Value Ref Range   WBC 9.4 4.6 - 10.2 K/uL   Lymph, poc 1.8 0.6 - 3.4   POC LYMPH PERCENT 19.5 10 - 50 %L   MID (cbc)  0 - 0.9   POC MID %  0 - 12 %M   POC Granulocyte 7.4 (A) 2 - 6.9   Granulocyte percent 78.8 37 - 80 %G   RBC 4.45 4.04 - 5.48 M/uL   Hemoglobin 11.9 (A) 12.2 - 16.2 g/dL   HCT, POC 37.9 37.7 - 47.9 %   MCV 85.2 80 - 97 fL   MCH, POC 26.8 (A) 27 - 31.2 pg   MCHC 31.5 (A) 31.8 - 35.4 g/dL   RDW, POC 13.6 %   Platelet Count, POC 405 142 - 424 K/uL   MPV 6.3 0 - 99.8 fL  POCT glycosylated hemoglobin (Hb A1C)  Result Value Ref Range   Hemoglobin A1C 6.9    The above results were reviewed with the patient and her son.     Assessment & Plan:  1. Type 2 diabetes mellitus without complication -The hemoglobin A1c was [name] today at 6.9% and the patient should continue with current treatment - POCT CBC - POCT glycosylated hemoglobin (Hb A1C)  2. Atrial fibrillation, unspecified -The patient remains in atrial fibrillation at a controlled rate of 72/m she is having no symptoms with this at the current time. No changes in treatment - POCT CBC  3. Essential hypertension -The blood pressure was elevated in the office but home readings are improved though some are elevated. Because of her elderly condition no change will be made in current treatment regimen. - POCT CBC - BMP8+EGFR - Hepatic function panel  4. Hyperlipidemia -Continue aggressive therapeutic lifestyle changes which include diet and exercise - POCT CBC - Lipid panel  5. Vitamin D deficiency -The patient is currently not taking any medication for vitamin D replacement  and we will make a decision regarding this when lab work is returned - POCT CBC - Vit D  25 hydroxy (rtn osteoporosis monitoring)  6. Hearing loss of both ears due to cerumen impaction -Ear irrigation is being done during the visit today.  7. Left MCA CVA with lower extremity weakness this occurred in 2014.  Meds ordered this encounter  Medications  . sertraline (ZOLOFT) 100 MG tablet    Sig: Take 1 tablet (100 mg total) by mouth daily.    Dispense:  30 tablet    Refill:  11  . metFORMIN (GLUCOPHAGE-XR) 500 MG 24 hr tablet    Sig: TAKE 1 TABLET ONCE DAILY WITH LARGEST MEAL.    Dispense:  90 tablet    Refill:  3  . apixaban (ELIQUIS)  2.5 MG TABS tablet    Sig: TAKE (1) TABLET TWICE A DAY.    Dispense:  60 tablet    Refill:  11  . diltiazem (CARDIZEM CD) 240 MG 24 hr capsule    Sig: TAKE (1) CAPSULE DAILY    Dispense:  30 capsule    Refill:  11   Patient Instructions                       Medicare Annual Wellness Visit  [facility] and the medical providers at [facility] strive to bring you the best medical care.  In doing so we not only want to address your current medical conditions and concerns but also to detect new conditions early and prevent illness, disease and health-related problems.    Medicare offers a yearly Wellness Visit which allows our clinical staff to assess your need for preventative services including immunizations, lifestyle education, counseling to decrease risk of preventable diseases and screening for fall risk and other medical concerns.    This visit is provided free of charge (no copay) for all Medicare recipients. The clinical pharmacists at [facility] have begun to conduct these Wellness Visits which will also include a thorough review of all your medications.    As you primary medical provider recommend that you make an appointment for your Annual Wellness Visit if you have not done so already this year.   You may set up this appointment before you leave today or you may call back ([phone]) and schedule an appointment.  Please make sure when you call that you mention that you are scheduling your Annual Wellness Visit with the clinical pharmacist so that the appointment may be made for the proper length of time.     Continue current medications. Continue [name] therapeutic lifestyle changes which include [name] diet and exercise. Fall precautions discussed with patient. If an FOBT was given today- please return it to our front desk. If you are over 98 years old - you may need Prevnar 74 or the adult Pneumonia vaccine.   After your visit with [place] today you will receive a survey in the mail or online from [employer] regarding your care with [place]. Please take a moment to fill this out. Your feedback is very important to [place] as you can help [place] better understand your patient needs as well as improve your experience and satisfaction. WE CARE ABOUT YOU!!!   The patient should continue to use her walker regularly and moves slowly and be careful not to fall She should continue with her current treatment and check her blood sugars and feet regularly. She should continue to drink plenty of fluids The CBC done today and hemoglobin A 1C were both [name] and within normal limits for this patient. There are other agents which can be used to improve the saliva in the mouth. One of them is called Oasis mouthwash or spray. Another one is call saliva sure lozenges--- these can be tried instead of the Biotene to see if this helps the dryness in her mouth. Also she is currently taking 100 mg of Zoloft and we might consider trying to reduce this down to 50 as this could certainly contribute to her dry mouth situation also   Arrie Senate MD

## 2015-03-21 LAB — BMP8+EGFR
BUN/Creatinine Ratio: 20 (ref 11–26)
BUN: 17 mg/dL (ref 10–36)
CHLORIDE: 100 mmol/L (ref 97–108)
CO2: 27 mmol/L (ref 18–29)
CREATININE: 0.84 mg/dL (ref 0.57–1.00)
Calcium: 10.3 mg/dL (ref 8.7–10.3)
GFR calc non Af Amer: 60 mL/min/{1.73_m2} (ref >59)
GFR, EST AFRICAN AMERICAN: 69 mL/min/{1.73_m2} (ref >59)
Glucose: 175 mg/dL — ABNORMAL HIGH (ref 65–99)
POTASSIUM: 4.8 mmol/L (ref 3.5–5.2)
Sodium: 142 mmol/L (ref 134–144)

## 2015-03-21 LAB — LIPID PANEL
Chol/HDL Ratio: 4.4 ratio units (ref 0.0–4.4)
Cholesterol, Total: 254 mg/dL — ABNORMAL HIGH (ref 100–199)
HDL: 58 mg/dL (ref >39)
LDL CALC: 157 mg/dL — AB (ref 0–99)
TRIGLYCERIDES: 197 mg/dL — AB (ref 0–149)
VLDL Cholesterol Cal: 39 mg/dL (ref 5–40)

## 2015-03-21 LAB — HEPATIC FUNCTION PANEL
ALT: 9 IU/L (ref 0–32)
AST: 15 IU/L (ref 0–40)
Albumin: 4.6 g/dL (ref 3.2–4.6)
Alkaline Phosphatase: 99 IU/L (ref 39–117)
Bilirubin Total: 0.2 mg/dL (ref 0.0–1.2)
Bilirubin, Direct: 0.08 mg/dL (ref 0.00–0.40)
TOTAL PROTEIN: 7.2 g/dL (ref 6.0–8.5)

## 2015-03-21 LAB — VITAMIN D 25 HYDROXY (VIT D DEFICIENCY, FRACTURES): VIT D 25 HYDROXY: 12.6 ng/mL — AB (ref 30.0–100.0)

## 2015-03-25 ENCOUNTER — Other Ambulatory Visit: Payer: Medicare Other

## 2015-03-25 DIAGNOSIS — Z1212 Encounter for screening for malignant neoplasm of rectum: Secondary | ICD-10-CM

## 2015-03-27 LAB — FECAL OCCULT BLOOD, IMMUNOCHEMICAL: FECAL OCCULT BLD: NEGATIVE

## 2015-06-25 ENCOUNTER — Telehealth: Payer: Self-pay | Admitting: *Deleted

## 2015-06-25 DIAGNOSIS — E119 Type 2 diabetes mellitus without complications: Secondary | ICD-10-CM

## 2015-06-25 NOTE — Telephone Encounter (Signed)
Spoke with patient's daughter-in-law. Need Microalbumin in order to close nephropathy gap. I will leave a urine specimen cup up front for them to pickup and return at their convenience within the next couple of weeks. Microalbumin ordered.

## 2015-09-24 ENCOUNTER — Encounter: Payer: Self-pay | Admitting: Family Medicine

## 2015-09-24 ENCOUNTER — Ambulatory Visit (INDEPENDENT_AMBULATORY_CARE_PROVIDER_SITE_OTHER): Payer: Medicare Other | Admitting: Family Medicine

## 2015-09-24 VITALS — BP 142/85 | HR 84 | Temp 96.9°F | Ht 60.0 in | Wt 109.0 lb

## 2015-09-24 DIAGNOSIS — E785 Hyperlipidemia, unspecified: Secondary | ICD-10-CM

## 2015-09-24 DIAGNOSIS — E559 Vitamin D deficiency, unspecified: Secondary | ICD-10-CM | POA: Diagnosis not present

## 2015-09-24 DIAGNOSIS — E119 Type 2 diabetes mellitus without complications: Secondary | ICD-10-CM | POA: Diagnosis not present

## 2015-09-24 DIAGNOSIS — H6123 Impacted cerumen, bilateral: Secondary | ICD-10-CM | POA: Diagnosis not present

## 2015-09-24 DIAGNOSIS — I10 Essential (primary) hypertension: Secondary | ICD-10-CM | POA: Diagnosis not present

## 2015-09-24 DIAGNOSIS — I481 Persistent atrial fibrillation: Secondary | ICD-10-CM

## 2015-09-24 DIAGNOSIS — I4891 Unspecified atrial fibrillation: Secondary | ICD-10-CM

## 2015-09-24 DIAGNOSIS — I4819 Other persistent atrial fibrillation: Secondary | ICD-10-CM

## 2015-09-24 LAB — POCT GLYCOSYLATED HEMOGLOBIN (HGB A1C): HEMOGLOBIN A1C: 6.9

## 2015-09-24 MED ORDER — APIXABAN 2.5 MG PO TABS: ORAL_TABLET | ORAL | Status: DC

## 2015-09-24 MED ORDER — DILTIAZEM HCL ER COATED BEADS 240 MG PO CP24: ORAL_CAPSULE | ORAL | Status: DC

## 2015-09-24 MED ORDER — METFORMIN HCL ER 500 MG PO TB24: ORAL_TABLET | ORAL | Status: DC

## 2015-09-24 MED ORDER — ONETOUCH ULTRA BLUE VI STRP: ORAL_STRIP | Status: DC

## 2015-09-24 MED ORDER — SERTRALINE HCL 100 MG PO TABS: 100.0000 mg | ORAL_TABLET | Freq: Every day | ORAL | Status: DC

## 2015-09-24 MED ORDER — MUPIROCIN 2 % EX OINT: 1.0000 "application " | TOPICAL_OINTMENT | Freq: Two times a day (BID) | CUTANEOUS | Status: DC

## 2015-09-24 NOTE — Progress Notes (Signed)
Subjective:    Patient ID: [name], female    DOB: 03/27/1918, 82 y.o.   MRN: [id number]  HPI Pt here for follow up and management of chronic medical problems which includes diabetes and hypertension. She is taking medications regularly. The patient is doing well with no specific complaints. She is concerned about a skin lesion on her nose. She is requesting refills on all her medicines. She will get her lab work done today. The patient is alert and responds appropriately to questions asked of her. She is 82 years old. She is using a walker regularly and does not get out of the house. Her son stays with her at nighttime and the have a sitter during the day. She has no specific complaints other than worrying about the Place on the right side of her nose. She does have a cataract and was encouraged to have this removed but has so far refused to do this. Her vision is blurred and she cannot read anymore because of this. I encouraged her and her son to reconsider and have this taken care of. She denies any chest pain or any more shortness of breath than usual. She swallowing her food without problems and denies any blood or black tarry stools. She does have problems with constipation and or loose stools and stays in the bathroom a lot with out a lot of results. The son says she is drinking plenty of fluids. She is passing her water without problems and does wear a depends. The check blood sugars at home and the blood sugars have been running between 150 and 175 typically. The blood pressures have been running in the 140-150 range over the 70s.      Patient Active Problem List   Diagnosis Date Noted  . Compression fracture   . Proximal humerus fracture 05/29/2014  . Chronic insomnia 04/03/2014  . Vitamin D deficiency 04/03/2014  . Diabetes mellitus ([place]) 08/19/2013  . Atrial fibrillation ([facility]) 08/19/2013  . High risk medication use 08/19/2013  . Hypertension 08/19/2013  . Hyperlipidemia  08/19/2013  . CLOSED FRACTURE UNSPECIFIED PART NECK FEMUR, history of 09/14/2009   Outpatient Encounter Prescriptions as of 09/24/2015  Medication Sig  . apixaban (ELIQUIS) 2.5 MG TABS tablet TAKE (1) TABLET TWICE A DAY.  Marland Kitchen diltiazem (CARDIZEM CD) 240 MG 24 hr capsule TAKE (1) CAPSULE DAILY  . metFORMIN (GLUCOPHAGE-XR) 500 MG 24 hr tablet TAKE 1 TABLET ONCE DAILY WITH LARGEST MEAL.  Marland Kitchen ONE TOUCH ULTRA TEST test strip USE TO CHECK BLOOD SUGARS EVERY DAY. E11.9  . sertraline (ZOLOFT) 100 MG tablet Take 1 tablet (100 mg total) by mouth daily.  . [DISCONTINUED] apixaban (ELIQUIS) 2.5 MG TABS tablet TAKE (1) TABLET TWICE A DAY.  . [DISCONTINUED] diltiazem (CARDIZEM CD) 240 MG 24 hr capsule TAKE (1) CAPSULE DAILY  . [DISCONTINUED] metFORMIN (GLUCOPHAGE-XR) 500 MG 24 hr tablet TAKE 1 TABLET ONCE DAILY WITH LARGEST MEAL.  . [DISCONTINUED] ONE TOUCH ULTRA TEST test strip USE TO CHECK BLOOD SUGARS EVERY DAY  . [DISCONTINUED] sertraline (ZOLOFT) 100 MG tablet Take 1 tablet (100 mg total) by mouth daily.   No facility-administered encounter medications on file as of 09/24/2015.      Review of Systems  Constitutional: Negative.   HENT: Negative.   Eyes: Negative.   Respiratory: Negative.   Cardiovascular: Negative.   Gastrointestinal: Negative.   Endocrine: Negative.   Genitourinary: Negative.   Musculoskeletal: Negative.   Skin: Negative.  Skin lesion - nose  Allergic/Immunologic: Negative.   Neurological: Negative.   Hematological: Negative.   Psychiatric/Behavioral: Negative.        Objective:   Physical Exam  Constitutional: She is oriented to person, place, and time. No distress.  Elderly but alert 82 year old female using a walker who comes to the visit with her son and caregiver  HENT:  Head: Normocephalic and atraumatic.  Right Ear: External ear normal.  Left Ear: External ear normal.  Nose: Nose normal.  Mouth/Throat: Oropharynx is clear and moist. No oropharyngeal  exudate.  Eyes: Conjunctivae and EOM are normal. Pupils are equal, round, and reactive to light. Right eye exhibits no discharge. Left eye exhibits no discharge. No scleral icterus.  Neck: Normal range of motion. Neck supple. No thyromegaly present.  Cardiovascular: Normal rate, normal heart sounds and intact distal pulses.   No murmur heard. The heart is irregular irregular at 84/m  Pulmonary/Chest: Effort normal and breath sounds normal. No respiratory distress. She has no wheezes. She has no rales. She exhibits no tenderness.  Clear anteriorly and posteriorly  Abdominal: Soft. Bowel sounds are normal. There is no tenderness. There is no rebound and no guarding.  Musculoskeletal: She exhibits no edema or tenderness.  The patient has some gait instability and uses a walker for ambulation  Lymphadenopathy:    She has no cervical adenopathy.  Neurological: She is alert and oriented to person, place, and time.  Skin: Skin is warm and dry. Rash noted.  There is a slight rash on the right side of her nose without drainage or redness  Psychiatric: She has a normal mood and affect. Her behavior is normal. Judgment and thought content normal.  Nursing note and vitals reviewed.   BP 189/82 mmHg  Pulse 84  Temp(Src) 96.9 F (36.1 C) (Oral)  Ht 5' (1.524 m)  Wt 109 lb (49.442 kg)  BMI 21.29 kg/m2  Repeat blood pressure was 142/85 in the right arm sitting     Assessment & Plan:  1. Type 2 diabetes mellitus without complication, unspecified long term insulin use status (HCC) -Continue current treatment and diet regimen pending results of lab work  2. Atrial fibrillation -The heart remains irregular irregular at about 84/m - CBC with Differential/Platelet  3. Essential hypertension -The blood pressure was elevated when she arrived and she did not taking her medicine. Repeat blood pressure was about 142/88 in the right arm sitting and because of her age no changes will be made in her blood  pressure. - BMP8+EGFR - CBC with Differential/Platelet - Hepatic function panel  4. Hyperlipidemia -She will continue with her diet and good cholesterol management through dieting. - CBC with Differential/Platelet - Lipid panel  5. Vitamin D deficiency -We will check a vitamin D level and make sure that no vitamin D is needed - CBC with Differential/Platelet - VITAMIN D 25 Hydroxy (Vit-D Deficiency, Fractures)  6. Persistent atrial fibrillation (HCC) -Continue blood thinner and current medication for heart regulation  7. Excessive cerumen in ear canal, bilateral -Use Debrox as directed  Meds ordered this encounter  Medications  . apixaban (ELIQUIS) 2.5 MG TABS tablet    Sig: TAKE (1) TABLET TWICE A DAY.    Dispense:  180 tablet    Refill:  3  . diltiazem (CARDIZEM CD) 240 MG 24 hr capsule    Sig: TAKE (1) CAPSULE DAILY    Dispense:  90 capsule    Refill:  3  . sertraline (ZOLOFT) 100 MG tablet  Sig: Take 1 tablet (100 mg total) by mouth daily.    Dispense:  90 tablet    Refill:  3  . metFORMIN (GLUCOPHAGE-XR) 500 MG 24 hr tablet    Sig: TAKE 1 TABLET ONCE DAILY WITH LARGEST MEAL.    Dispense:  90 tablet    Refill:  3  . ONE TOUCH ULTRA TEST test strip    Sig: USE TO CHECK BLOOD SUGARS EVERY DAY. E11.9    Dispense:  100 each    Refill:  11  . mupirocin ointment (BACTROBAN) 2 %    Sig: Apply 1 application topically 2 (two) times daily.    Dispense:  22 g    Refill:  2   Patient Instructions                       Medicare Annual Wellness Visit  [facility] and the medical providers at [facility] strive to bring you the best medical care.  In doing so we not only want to address your current medical conditions and concerns but also to detect new conditions early and prevent illness, disease and health-related problems.    Medicare offers a yearly Wellness Visit which allows our clinical staff to assess your need for preventative services  including immunizations, lifestyle education, counseling to decrease risk of preventable diseases and screening for fall risk and other medical concerns.    This visit is provided free of charge (no copay) for all Medicare recipients. The clinical pharmacists at [facility] have begun to conduct these Wellness Visits which will also include a thorough review of all your medications.    As you primary medical provider recommend that you make an appointment for your Annual Wellness Visit if you have not done so already this year.  You may set up this appointment before you leave today or you may call back ([phone]) and schedule an appointment.  Please make sure when you call that you mention that you are scheduling your Annual Wellness Visit with the clinical pharmacist so that the appointment may be made for the proper length of time.     Continue current medications. Continue good therapeutic lifestyle changes which include good diet and exercise. Fall precautions discussed with patient. If an FOBT was given today- please return it to our front desk. If you are over 76 years old - you may need Prevnar 2 or the adult Pneumonia vaccine.  **Flu shots are available--- please call and schedule a FLU-CLINIC appointment**  After your visit with [place] today you will receive a survey in the mail or online from [employer] regarding your care with [place]. Please take a moment to fill this out. Your feedback is very important to [place] as you can help [place] better understand your patient needs as well as improve your experience and satisfaction. WE CARE ABOUT YOU!!!   Use Bactroban ointment sparingly to the right side of the nose 2-3 times daily for the next couple weeks. If the irritated area does not get better please call us in 4 weeks and we will have the dermatologist to look at this. Continue to try to be active around the house with assistance Always be careful and do not put yourself at  risk for falling Consider getting the cataract removed so that you can see better Continue to drink plenty of fluids and stay well hydrated Try Citrucel low sugar daily to see if this helps your bowel  movements work better [employer] can purchase Debrox eardrops and use 2-3 drops in each ear for 2 or 3 nights in a row wait a week and repeat for 2-3 nights in a row and this could help soften earwax so that hearing can be better.   Arrie Senate MD

## 2015-09-24 NOTE — Patient Instructions (Addendum)
Medicare Annual Wellness Visit  [facility] and the medical providers at [facility] strive to bring you the best medical care.  In doing so we not only want to address your current medical conditions and concerns but also to detect new conditions early and prevent illness, disease and health-related problems.    Medicare offers a yearly Wellness Visit which allows our clinical staff to assess your need for preventative services including immunizations, lifestyle education, counseling to decrease risk of preventable diseases and screening for fall risk and other medical concerns.    This visit is provided free of charge (no copay) for all Medicare recipients. The clinical pharmacists at [facility] have begun to conduct these Wellness Visits which will also include a thorough review of all your medications.    As you primary medical provider recommend that you make an appointment for your Annual Wellness Visit if you have not done so already this year.  You may set up this appointment before you leave today or you may call back [phone] and schedule an appointment.  Please make sure when you call that you mention that you are scheduling your Annual Wellness Visit with the clinical pharmacist so that the appointment may be made for the proper length of time.     Continue current medications. Continue good therapeutic lifestyle changes which include good diet and exercise. Fall precautions discussed with patient. If an FOBT was given today- please return it to our front desk. If you are over 52 years old - you may need Prevnar 7 or the adult Pneumonia vaccine.  **Flu shots are available--- please call and schedule a FLU-CLINIC appointment**  After your visit with [place] today you will receive a survey in the mail or online from [employer] regarding your care with [place]. Please take a moment to fill this out. Your feedback is very  important to [place] as you can help [place] better understand your patient needs as well as improve your experience and satisfaction. WE CARE ABOUT YOU!!!   Use Bactroban ointment sparingly to the right side of the nose 2-3 times daily for the next couple weeks. If the irritated area does not get better please call us in 4 weeks and we will have the dermatologist to look at this. Continue to try to be active around the house with assistance Always be careful and do not put yourself at risk for falling Consider getting the cataract removed so that you can see better Continue to drink plenty of fluids and stay well hydrated Try Citrucel low sugar daily to see if this helps your bowel movements work better You can purchase Debrox eardrops and use 2-3 drops in each ear for 2 or 3 nights in a row wait a week and repeat for 2-3 nights in a row and this could help soften earwax so that hearing can be better.

## 2015-09-25 LAB — BMP8+EGFR
BUN/Creatinine Ratio: 22 (ref 11–26)
BUN: 20 mg/dL (ref 10–36)
CO2: 24 mmol/L (ref 18–29)
CREATININE: 0.93 mg/dL (ref 0.57–1.00)
Calcium: 10.4 mg/dL — ABNORMAL HIGH (ref 8.7–10.3)
Chloride: 102 mmol/L (ref 96–106)
GFR calc Af Amer: 60 mL/min/{1.73_m2} (ref >59)
GFR, EST NON AFRICAN AMERICAN: 52 mL/min/{1.73_m2} — AB (ref >59)
GLUCOSE: 153 mg/dL — AB (ref 65–99)
Potassium: 5.4 mmol/L — ABNORMAL HIGH (ref 3.5–5.2)
SODIUM: 144 mmol/L (ref 134–144)

## 2015-09-25 LAB — LIPID PANEL
CHOL/HDL RATIO: 4.1 ratio (ref 0.0–4.4)
Cholesterol, Total: 224 mg/dL — ABNORMAL HIGH (ref 100–199)
HDL: 54 mg/dL (ref >39)
LDL Calculated: 129 mg/dL — ABNORMAL HIGH (ref 0–99)
Triglycerides: 204 mg/dL — ABNORMAL HIGH (ref 0–149)
VLDL Cholesterol Cal: 41 mg/dL — ABNORMAL HIGH (ref 5–40)

## 2015-09-25 LAB — HEPATIC FUNCTION PANEL
ALT: 7 IU/L (ref 0–32)
AST: 14 IU/L (ref 0–40)
Albumin: 4.5 g/dL (ref 3.2–4.6)
Alkaline Phosphatase: 83 IU/L (ref 39–117)
BILIRUBIN TOTAL: 0.3 mg/dL (ref 0.0–1.2)
Bilirubin, Direct: 0.08 mg/dL (ref 0.00–0.40)
TOTAL PROTEIN: 7.1 g/dL (ref 6.0–8.5)

## 2015-09-25 LAB — MICROALBUMIN / CREATININE URINE RATIO
Creatinine, Urine: 94.1 mg/dL
MICROALB/CREAT RATIO: 161.7 mg/g{creat} — AB (ref 0.0–30.0)
MICROALBUM., U, RANDOM: 152.2 ug/mL

## 2015-09-25 LAB — CBC WITH DIFFERENTIAL/PLATELET
BASOS: 0 %
Basophils Absolute: 0 10*3/uL (ref 0.0–0.2)
EOS (ABSOLUTE): 0.1 10*3/uL (ref 0.0–0.4)
EOS: 1 %
HEMATOCRIT: 35.2 % (ref 34.0–46.6)
Hemoglobin: 12 g/dL (ref 11.1–15.9)
Immature Grans (Abs): 0 10*3/uL (ref 0.0–0.1)
Immature Granulocytes: 0 %
Lymphocytes Absolute: 1.6 10*3/uL (ref 0.7–3.1)
Lymphs: 17 %
MCH: 28.2 pg (ref 26.6–33.0)
MCHC: 34.1 g/dL (ref 31.5–35.7)
MCV: 83 fL (ref 79–97)
MONOS ABS: 0.4 10*3/uL (ref 0.1–0.9)
Monocytes: 4 %
Neutrophils Absolute: 7.4 10*3/uL — ABNORMAL HIGH (ref 1.4–7.0)
Neutrophils: 78 %
Platelets: 285 10*3/uL (ref 150–379)
RBC: 4.26 x10E6/uL (ref 3.77–5.28)
RDW: 14 % (ref 12.3–15.4)
WBC: 9.5 10*3/uL (ref 3.4–10.8)

## 2015-09-25 LAB — VITAMIN D 25 HYDROXY (VIT D DEFICIENCY, FRACTURES): Vit D, 25-Hydroxy: 9.7 ng/mL — ABNORMAL LOW (ref 30.0–100.0)

## 2015-09-25 MED ORDER — VITAMIN D (ERGOCALCIFEROL) 1.25 MG (50000 UNIT) PO CAPS: 50000.0000 [IU] | ORAL_CAPSULE | ORAL | Status: DC

## 2015-09-25 NOTE — Addendum Note (Signed)
Addended by: Michaela Corner on: 09/25/2015 10:13 AM   Modules accepted: Orders

## 2015-10-08 ENCOUNTER — Other Ambulatory Visit (INDEPENDENT_AMBULATORY_CARE_PROVIDER_SITE_OTHER): Payer: Medicare Other

## 2015-10-08 DIAGNOSIS — I10 Essential (primary) hypertension: Secondary | ICD-10-CM

## 2015-10-08 NOTE — Progress Notes (Signed)
Lab only 

## 2015-10-09 LAB — BMP8+EGFR
BUN / CREAT RATIO: 17 (ref 11–26)
BUN: 15 mg/dL (ref 10–36)
CALCIUM: 10.2 mg/dL (ref 8.7–10.3)
CHLORIDE: 102 mmol/L (ref 96–106)
CO2: 23 mmol/L (ref 18–29)
CREATININE: 0.86 mg/dL (ref 0.57–1.00)
GFR calc Af Amer: 66 mL/min/{1.73_m2} (ref >59)
GFR calc non Af Amer: 58 mL/min/{1.73_m2} — ABNORMAL LOW (ref >59)
GLUCOSE: 120 mg/dL — AB (ref 65–99)
Potassium: 5 mmol/L (ref 3.5–5.2)
Sodium: 143 mmol/L (ref 134–144)

## 2016-01-29 ENCOUNTER — Ambulatory Visit: Payer: Medicare Other | Admitting: Family Medicine

## 2016-03-30 ENCOUNTER — Ambulatory Visit (INDEPENDENT_AMBULATORY_CARE_PROVIDER_SITE_OTHER): Payer: Medicare Other | Admitting: Family Medicine

## 2016-03-30 ENCOUNTER — Encounter: Payer: Self-pay | Admitting: Family Medicine

## 2016-03-30 VITALS — BP 181/73 | HR 82 | Temp 96.8°F | Ht 60.0 in | Wt 108.0 lb

## 2016-03-30 DIAGNOSIS — E559 Vitamin D deficiency, unspecified: Secondary | ICD-10-CM

## 2016-03-30 DIAGNOSIS — L989 Disorder of the skin and subcutaneous tissue, unspecified: Secondary | ICD-10-CM | POA: Diagnosis not present

## 2016-03-30 DIAGNOSIS — I4891 Unspecified atrial fibrillation: Secondary | ICD-10-CM | POA: Diagnosis not present

## 2016-03-30 DIAGNOSIS — E119 Type 2 diabetes mellitus without complications: Secondary | ICD-10-CM | POA: Diagnosis not present

## 2016-03-30 DIAGNOSIS — E785 Hyperlipidemia, unspecified: Secondary | ICD-10-CM | POA: Diagnosis not present

## 2016-03-30 DIAGNOSIS — I10 Essential (primary) hypertension: Secondary | ICD-10-CM | POA: Diagnosis not present

## 2016-03-30 LAB — BAYER DCA HB A1C WAIVED: HB A1C (BAYER DCA - WAIVED): 6.9 % (ref <7.0)

## 2016-03-30 MED ORDER — METFORMIN HCL ER 500 MG PO TB24: ORAL_TABLET | ORAL | 11 refills | Status: DC

## 2016-03-30 MED ORDER — SERTRALINE HCL 100 MG PO TABS
100.0000 mg | ORAL_TABLET | Freq: Every day | ORAL | 11 refills | Status: DC
Start: 2016-03-30 — End: 2017-03-24

## 2016-03-30 MED ORDER — DILTIAZEM HCL ER COATED BEADS 240 MG PO CP24: ORAL_CAPSULE | ORAL | 11 refills | Status: DC

## 2016-03-30 MED ORDER — APIXABAN 2.5 MG PO TABS: ORAL_TABLET | ORAL | 11 refills | Status: DC

## 2016-03-30 NOTE — Progress Notes (Signed)
Subjective:    Patient ID: [name], female    DOB: [birth year], 82 y.o.   MRN: [id number]  HPI Pt here for follow up and management of chronic medical problems which includes, Atrial fibrillation, hyperlipidemia, hypertension, and diabetes. She is taking medications regularly.The patient comes today with her son and caregiver. The son stays with her every night. They are not aware of any issues or problems other than her age of 82. She is alert and answers questions appropriately. She has not had any falls. She denies chest pain shortness of breath trouble swallowing heartburn indigestion nausea vomiting diarrhea or blood in the stool. She is passing her water well and frequently but no burning and no blood that she is noticed. She brings in blood pressures for review and the outside readings are generally running in the 150s over the 70-80 range. These readings will be scanned into the record.      Patient Active Problem List   Diagnosis Date Noted  . Compression fracture   . Proximal humerus fracture 05/29/2014  . Chronic insomnia 04/03/2014  . Vitamin D deficiency 04/03/2014  . Diabetes mellitus ([place]) 08/19/2013  . Atrial fibrillation ([place]) 08/19/2013  . High risk medication use 08/19/2013  . Hypertension 08/19/2013  . Hyperlipidemia 08/19/2013  . CLOSED FRACTURE UNSPECIFIED PART NECK FEMUR, history of 09/14/2009   Outpatient Encounter Prescriptions as of 03/30/2016  Medication Sig  . apixaban (ELIQUIS) 2.5 MG TABS tablet TAKE (1) TABLET TWICE A DAY.  Marland Kitchen diltiazem (CARDIZEM CD) 240 MG 24 hr capsule TAKE (1) CAPSULE DAILY  . metFORMIN (GLUCOPHAGE-XR) 500 MG 24 hr tablet TAKE 1 TABLET ONCE DAILY WITH LARGEST MEAL.  . mupirocin ointment (BACTROBAN) 2 % Apply 1 application topically 2 (two) times daily.  . ONE TOUCH ULTRA TEST test strip USE TO CHECK BLOOD SUGARS EVERY DAY. E11.9  . sertraline (ZOLOFT) 100 MG tablet Take 1 tablet (100 mg total) by mouth daily.  . Vitamin D,  Ergocalciferol, (DRISDOL) 50000 units CAPS capsule Take 1 capsule (50,000 Units total) by mouth every 7 (seven) days.   No facility-administered encounter medications on file as of 03/30/2016.       Review of Systems  Constitutional: Negative.   HENT: Negative.   Eyes: Negative.   Respiratory: Negative.   Cardiovascular: Negative.   Gastrointestinal: Negative.   Endocrine: Negative.   Genitourinary: Negative.   Musculoskeletal: Negative.   Skin: Negative.   Allergic/Immunologic: Negative.   Neurological: Negative.   Hematological: Negative.   Psychiatric/Behavioral: Negative.        Objective:   Physical Exam  Constitutional: She is oriented to person, place, and time. She appears well-developed and well-nourished.  Elderly but alert and responding to questions appropriately  HENT:  Head: Normocephalic and atraumatic.  Right Ear: External ear normal.  Left Ear: External ear normal.  Nose: Nose normal.  Mouth/Throat: Oropharynx is clear and moist.  Well-hydrated  Eyes: Conjunctivae and EOM are normal. Pupils are equal, round, and reactive to light. Right eye exhibits no discharge. Left eye exhibits no discharge. No scleral icterus.  Impaired vision from CVA  Neck: Normal range of motion. Neck supple. No thyromegaly present.  Bilateral carotid bruits  Cardiovascular: Normal rate and intact distal pulses.   Murmur heard. The heart is irregular irregular with a grade 2/6 systolic ejection murmur that radiates to her neck.  Pulmonary/Chest: Effort normal and breath sounds normal. No respiratory distress. She has no wheezes. She has no rales.  Clear anteriorly and  posteriorly  Abdominal: Soft. Bowel sounds are normal. She exhibits no mass. There is no tenderness. There is no rebound and no guarding.  No abdominal tenderness masses palpable  Musculoskeletal: Normal range of motion. She exhibits no edema.  Lymphadenopathy:    She has no cervical adenopathy.  Neurological: She  is alert and oriented to person, place, and time. She has normal reflexes. No cranial nerve deficit.  Skin: Skin is warm and dry. No rash noted.  The patient does have a smooth nodular lesion on the right side of her nose and will be referred to the dermatologist for further evaluation  Psychiatric: She has a normal mood and affect. Her behavior is normal. Judgment and thought content normal.  Nursing note and vitals reviewed.  BP (!) 181/73 (BP Location: Right Arm)   Pulse 82   Temp (!) 96.8 F (36 C) (Oral)   Ht 5' (1.524 m)   Wt 108 lb (49 kg)   BMI 21.09 kg/m         Assessment & Plan:  1. Type 2 diabetes mellitus without complication, unspecified long term insulin use status (HCC) -The blood sugars at home are running in the 150s and these were from readings brought in by the patient and I will be scanned into the record. She will continue with her current treatment and therapeutic lifestyle changes. - Bayer DCA Hb A1c Waived - BMP8+EGFR - CBC with Differential/Platelet  2. Atrial fibrillation, unspecified -The patient continues with atrial fibrillation rate controlled and is having no problems with this. She is on her blood thinner. Eliquis. - CBC with Differential/Platelet  3. Essential hypertension -The blood pressure is elevated in the office and this is typical for this patient. Her home readings are running in the 150s over the 70 to 80s and we will not make any changes with her medication. - BMP8+EGFR - CBC with Differential/Platelet - Hepatic function panel  4. Hyperlipidemia -Continue with therapeutic lifestyle changes as possible - CBC with Differential/Platelet - Lipid panel  5. Vitamin D deficiency -Continue with current treatment pending results of lab work - CBC with Differential/Platelet - VITAMIN D 25 Hydroxy (Vit-D Deficiency, Fractures)  6. Skin lesion of face -This is a small smooth elevated lesion on the right side of her nose and she will be  referred to dermatologist for further evaluation and biopsy  Meds ordered this encounter  Medications  . apixaban (ELIQUIS) 2.5 MG TABS tablet    Sig: TAKE (1) TABLET TWICE A DAY.    Dispense:  60 tablet    Refill:  11  . diltiazem (CARDIZEM CD) 240 MG 24 hr capsule    Sig: TAKE (1) CAPSULE DAILY    Dispense:  30 capsule    Refill:  11  . metFORMIN (GLUCOPHAGE-XR) 500 MG 24 hr tablet    Sig: TAKE 1 TABLET ONCE DAILY WITH LARGEST MEAL.    Dispense:  30 tablet    Refill:  11  . sertraline (ZOLOFT) 100 MG tablet    Sig: Take 1 tablet (100 mg total) by mouth daily.    Dispense:  30 tablet    Refill:  11   Patient Instructions                       Medicare Annual Wellness Visit  [facility] and the medical providers at [facility] strive to bring you the best medical care.  In doing so we not only want to  address your current medical conditions and concerns but also to detect new conditions early and prevent illness, disease and health-related problems.    Medicare offers a yearly Wellness Visit which allows our clinical staff to assess your need for preventative services including immunizations, lifestyle education, counseling to decrease risk of preventable diseases and screening for fall risk and other medical concerns.    This visit is provided free of charge (no copay) for all Medicare recipients. The clinical pharmacists at [facility] have begun to conduct these Wellness Visits which will also include a thorough review of all your medications.    As you primary medical provider recommend that you make an appointment for your Annual Wellness Visit if you have not done so already this year.  You may set up this appointment before you leave today or you may call back [phone]) and schedule an appointment.  Please make sure when you call that you mention that you are scheduling your Annual Wellness Visit with the clinical pharmacist so that  the appointment may be made for the proper length of time.     Continue current medications. Continue good therapeutic lifestyle changes which include good diet and exercise. Fall precautions discussed with patient. If an FOBT was given today- please return it to our front desk. If you are over 64 years old - you may need Prevnar 51 or the adult Pneumonia vaccine.   After your visit with [place] today you will receive a survey in the mail or online from [employer] regarding your care with [place]. Please take a moment to fill this out. Your feedback is very important to [place] as you can help [place] better understand your patient needs as well as improve your experience and satisfaction. WE CARE ABOUT YOU!!!   Continue to make every effort to keep the patient from falling She should use her walker regularly Because of the skin lesion on the right side of her nose we will make arrangements for her to see the dermatologist for further evaluation of this. She should continue to drink plenty of fluids and stay well hydrated She'll need to get a flu shot in October Return the FOBT      Arrie Senate MD

## 2016-03-30 NOTE — Patient Instructions (Addendum)
Medicare Annual Wellness Visit  [facility] and the medical providers at [facility] strive to bring you the best medical care.  In doing so we not only want to address your current medical conditions and concerns but also to detect new conditions early and prevent illness, disease and health-related problems.    Medicare offers a yearly Wellness Visit which allows our clinical staff to assess your need for preventative services including immunizations, lifestyle education, counseling to decrease risk of preventable diseases and screening for fall risk and other medical concerns.    This visit is provided free of charge (no copay) for all Medicare recipients. The clinical pharmacists at [facility] have begun to conduct these Wellness Visits which will also include a thorough review of all your medications.    As you primary medical provider recommend that you make an appointment for your Annual Wellness Visit if you have not done so already this year.  You may set up this appointment before you leave today or you may call back [phone] and schedule an appointment.  Please make sure when you call that you mention that you are scheduling your Annual Wellness Visit with the clinical pharmacist so that the appointment may be made for the proper length of time.     Continue current medications. Continue good therapeutic lifestyle changes which include good diet and exercise. Fall precautions discussed with patient. If an FOBT was given today- please return it to our front desk. If you are over 38 years old - you may need Prevnar 63 or the adult Pneumonia vaccine.   After your visit with [place] today you will receive a survey in the mail or online from [employer] regarding your care with [place]. Please take a moment to fill this out. Your feedback is very important to [place] as you can help [place] better understand your patient needs as well as  improve your experience and satisfaction. WE CARE ABOUT YOU!!!   Continue to make every effort to keep the patient from falling She should use her walker regularly Because of the skin lesion on the right side of her nose we will make arrangements for her to see the dermatologist for further evaluation of this. She should continue to drink plenty of fluids and stay well hydrated She'll need to get a flu shot in October Return the FOBT

## 2016-03-30 NOTE — Addendum Note (Signed)
Addended by: Zannie Cove on: 03/30/2016 10:25 AM   Modules accepted: Orders

## 2016-03-31 LAB — CBC WITH DIFFERENTIAL/PLATELET
BASOS: 0 %
Basophils Absolute: 0 10*3/uL (ref 0.0–0.2)
EOS (ABSOLUTE): 0.1 10*3/uL (ref 0.0–0.4)
EOS: 1 %
Hematocrit: 34.4 % (ref 34.0–46.6)
Hemoglobin: 11.5 g/dL (ref 11.1–15.9)
Immature Grans (Abs): 0 10*3/uL (ref 0.0–0.1)
Immature Granulocytes: 0 %
LYMPHS ABS: 1.5 10*3/uL (ref 0.7–3.1)
Lymphs: 18 %
MCH: 27.8 pg (ref 26.6–33.0)
MCHC: 33.4 g/dL (ref 31.5–35.7)
MCV: 83 fL (ref 79–97)
MONOS ABS: 0.4 10*3/uL (ref 0.1–0.9)
Monocytes: 5 %
Neutrophils Absolute: 6 10*3/uL (ref 1.4–7.0)
Neutrophils: 76 %
PLATELETS: 276 10*3/uL (ref 150–379)
RBC: 4.14 x10E6/uL (ref 3.77–5.28)
RDW: 15.3 % (ref 12.3–15.4)
WBC: 8 10*3/uL (ref 3.4–10.8)

## 2016-03-31 LAB — BMP8+EGFR
BUN / CREAT RATIO: 18 (ref 12–28)
BUN: 19 mg/dL (ref 10–36)
CO2: 24 mmol/L (ref 18–29)
CREATININE: 1.06 mg/dL — AB (ref 0.57–1.00)
Calcium: 9.8 mg/dL (ref 8.7–10.3)
Chloride: 102 mmol/L (ref 96–106)
GFR calc Af Amer: 52 mL/min/{1.73_m2} — ABNORMAL LOW (ref >59)
GFR, EST NON AFRICAN AMERICAN: 45 mL/min/{1.73_m2} — AB (ref >59)
GLUCOSE: 153 mg/dL — AB (ref 65–99)
Potassium: 4.8 mmol/L (ref 3.5–5.2)
SODIUM: 144 mmol/L (ref 134–144)

## 2016-03-31 LAB — LIPID PANEL
CHOLESTEROL TOTAL: 199 mg/dL (ref 100–199)
Chol/HDL Ratio: 3.6 ratio units (ref 0.0–4.4)
HDL: 56 mg/dL (ref >39)
LDL CALC: 115 mg/dL — AB (ref 0–99)
TRIGLYCERIDES: 141 mg/dL (ref 0–149)
VLDL Cholesterol Cal: 28 mg/dL (ref 5–40)

## 2016-03-31 LAB — HEPATIC FUNCTION PANEL
ALT: 7 IU/L (ref 0–32)
AST: 17 IU/L (ref 0–40)
Albumin: 4.4 g/dL (ref 3.2–4.6)
Alkaline Phosphatase: 83 IU/L (ref 39–117)
BILIRUBIN, DIRECT: 0.07 mg/dL (ref 0.00–0.40)
Bilirubin Total: 0.2 mg/dL (ref 0.0–1.2)
TOTAL PROTEIN: 7 g/dL (ref 6.0–8.5)

## 2016-03-31 LAB — VITAMIN D 25 HYDROXY (VIT D DEFICIENCY, FRACTURES): VIT D 25 HYDROXY: 28.8 ng/mL — AB (ref 30.0–100.0)

## 2016-04-06 ENCOUNTER — Other Ambulatory Visit: Payer: Medicare Other

## 2016-04-06 DIAGNOSIS — Z1211 Encounter for screening for malignant neoplasm of colon: Secondary | ICD-10-CM

## 2016-04-08 LAB — FECAL OCCULT BLOOD, IMMUNOCHEMICAL: Fecal Occult Bld: POSITIVE — AB

## 2016-04-11 ENCOUNTER — Other Ambulatory Visit: Payer: Self-pay | Admitting: *Deleted

## 2016-04-11 DIAGNOSIS — R195 Other fecal abnormalities: Secondary | ICD-10-CM

## 2016-04-19 ENCOUNTER — Encounter: Payer: Self-pay | Admitting: *Deleted

## 2016-05-23 ENCOUNTER — Encounter: Payer: Self-pay | Admitting: *Deleted

## 2016-07-12 ENCOUNTER — Telehealth: Payer: Self-pay | Admitting: Family Medicine

## 2016-09-21 ENCOUNTER — Ambulatory Visit: Payer: Medicare Other | Admitting: Family Medicine

## 2016-09-22 ENCOUNTER — Ambulatory Visit: Payer: Medicare Other | Admitting: Family Medicine

## 2016-09-27 ENCOUNTER — Ambulatory Visit: Payer: Medicare Other | Admitting: Family Medicine

## 2016-09-28 ENCOUNTER — Other Ambulatory Visit: Payer: Self-pay | Admitting: Family Medicine

## 2016-11-23 ENCOUNTER — Other Ambulatory Visit: Payer: Self-pay | Admitting: Family Medicine

## 2017-02-13 ENCOUNTER — Other Ambulatory Visit: Payer: Self-pay | Admitting: Family Medicine

## 2017-02-23 ENCOUNTER — Telehealth: Payer: Self-pay | Admitting: Family Medicine

## 2017-02-24 NOTE — Telephone Encounter (Signed)
appt scheduled Pt notified 

## 2017-03-15 ENCOUNTER — Telehealth: Payer: Self-pay | Admitting: Family Medicine

## 2017-03-15 NOTE — Telephone Encounter (Signed)
NA x2

## 2017-03-15 NOTE — Telephone Encounter (Signed)
Advice- do not see any morning apts this month for her to change her apt. Only triage apts.

## 2017-03-23 ENCOUNTER — Other Ambulatory Visit: Payer: Self-pay | Admitting: *Deleted

## 2017-03-23 MED ORDER — DILTIAZEM HCL ER COATED BEADS 240 MG PO CP24: ORAL_CAPSULE | ORAL | 0 refills | Status: DC

## 2017-03-23 NOTE — Telephone Encounter (Signed)
Appt 03/29/17

## 2017-03-24 ENCOUNTER — Other Ambulatory Visit: Payer: Self-pay | Admitting: Family Medicine

## 2017-03-29 ENCOUNTER — Ambulatory Visit (INDEPENDENT_AMBULATORY_CARE_PROVIDER_SITE_OTHER): Payer: Medicare Other | Admitting: Family Medicine

## 2017-03-29 ENCOUNTER — Encounter: Payer: Self-pay | Admitting: Family Medicine

## 2017-03-29 VITALS — BP 172/76 | HR 64 | Temp 97.3°F | Ht 60.0 in | Wt 108.0 lb

## 2017-03-29 DIAGNOSIS — E7800 Pure hypercholesterolemia, unspecified: Secondary | ICD-10-CM

## 2017-03-29 DIAGNOSIS — I4819 Other persistent atrial fibrillation: Secondary | ICD-10-CM

## 2017-03-29 DIAGNOSIS — H6123 Impacted cerumen, bilateral: Secondary | ICD-10-CM

## 2017-03-29 DIAGNOSIS — E119 Type 2 diabetes mellitus without complications: Secondary | ICD-10-CM

## 2017-03-29 DIAGNOSIS — I10 Essential (primary) hypertension: Secondary | ICD-10-CM

## 2017-03-29 DIAGNOSIS — I481 Persistent atrial fibrillation: Secondary | ICD-10-CM

## 2017-03-29 DIAGNOSIS — L989 Disorder of the skin and subcutaneous tissue, unspecified: Secondary | ICD-10-CM

## 2017-03-29 DIAGNOSIS — E559 Vitamin D deficiency, unspecified: Secondary | ICD-10-CM

## 2017-03-29 DIAGNOSIS — I739 Peripheral vascular disease, unspecified: Secondary | ICD-10-CM

## 2017-03-29 LAB — BAYER DCA HB A1C WAIVED: HB A1C (BAYER DCA - WAIVED): 6.3 % (ref <7.0)

## 2017-03-29 MED ORDER — APIXABAN 2.5 MG PO TABS: ORAL_TABLET | ORAL | 11 refills | Status: DC

## 2017-03-29 MED ORDER — METFORMIN HCL ER 500 MG PO TB24: ORAL_TABLET | ORAL | 3 refills | Status: DC

## 2017-03-29 MED ORDER — DILTIAZEM HCL ER COATED BEADS 240 MG PO CP24: ORAL_CAPSULE | ORAL | 11 refills | Status: DC

## 2017-03-29 MED ORDER — SERTRALINE HCL 100 MG PO TABS: 100.0000 mg | ORAL_TABLET | Freq: Every day | ORAL | 11 refills | Status: DC

## 2017-03-29 NOTE — Progress Notes (Signed)
Subjective:    Patient ID: [name], female    DOB: 29-May-1918, 83 y.o.   MRN: [id number]  HPI Pt here for follow up and management of chronic medical problems which includes hyperlipidemia, a fib, and hypertension. She is taking medication regularly.The patient has been stable according to her is her son. She has a lot of trouble with her hemorrhoids and according to the caregiver may bleed a lot and then sometimes doesn't bleed at all. She is becoming more withdrawn and not wanting to get out. She did not keep her appointment a couple years ago with the surgeon about hemorrhoids and most recently she refuses to go see the dermatologist about the lesion on her nose. The son says that her appetite is diminished but the patient says she is getting all she wants to eat. Her weight today is 108 pounds and this is what it was when it was checked the last time. She denies any chest pain or shortness of breath. She denies any trouble with swallowing heartburn indigestion nausea vomiting diarrhea or black tarry bowel movements. She does have the bright red blood from the hemorrhoids. She is passing her water okay but just frequently. We will try one more time to get the appointment with the dermatologist regarding the lesion on her nose. We will get blood work today. Her son says her blood sugars are running like they usually run in in the past that has been pretty good. She does not get out much at all and she doesn't really want to get out. They're requesting refills on all her medicines.     Patient Active Problem List   Diagnosis Date Noted  . Compression fracture   . Proximal humerus fracture 05/29/2014  . Chronic insomnia 04/03/2014  . Vitamin D deficiency 04/03/2014  . Diabetes mellitus ([place]) 08/19/2013  . Atrial fibrillation ([place]) 08/19/2013  . High risk medication use 08/19/2013  . Hypertension 08/19/2013  . Hyperlipidemia 08/19/2013  . CLOSED FRACTURE UNSPECIFIED PART NECK FEMUR,  history of 09/14/2009   Outpatient Encounter Prescriptions as of 03/29/2017  Medication Sig  . apixaban (ELIQUIS) 2.5 MG TABS tablet TAKE (1) TABLET TWICE A DAY.  Marland Kitchen diltiazem (CARDIZEM CD) 240 MG 24 hr capsule TAKE (1) CAPSULE DAILY  . metFORMIN (GLUCOPHAGE-XR) 500 MG 24 hr tablet TAKE 1 TABLET ONCE DAILY WITH LARGEST MEAL.  Marland Kitchen sertraline (ZOLOFT) 100 MG tablet TAKE 1 TABLET DAILY  . [DISCONTINUED] mupirocin ointment (BACTROBAN) 2 % Apply 1 application topically 2 (two) times daily.  . [DISCONTINUED] ONE TOUCH ULTRA TEST test strip USE TO CHECK BLOOD SUGARS EVERY DAY. E11.9  . [DISCONTINUED] Vitamin D, Ergocalciferol, (DRISDOL) 50000 units CAPS capsule Take 1 capsule (50,000 Units total) by mouth every 7 (seven) days.   No facility-administered encounter medications on file as of 03/29/2017.       Review of Systems  Constitutional: Negative.   HENT: Negative.   Eyes: Negative.   Respiratory: Negative.   Cardiovascular: Negative.   Gastrointestinal: Negative.        Hemorrhoids  Endocrine: Negative.   Genitourinary: Negative.   Musculoskeletal: Negative.   Skin: Negative.   Allergic/Immunologic: Negative.   Neurological: Negative.   Hematological: Negative.   Psychiatric/Behavioral: Negative.        Objective:   Physical Exam  Constitutional: She is oriented to person, place, and time. No distress.  Elderly and fairly alert. Patient does not want to go to pursue any kind of additional healthcare other than what  she gets when she comes to the visit to this office. She is agreeable to go see the dermatologist about a lesion on her nose.  HENT:  Head: Normocephalic and atraumatic.  Left Ear: External ear normal.  Nose: Nose normal.  Mouth/Throat: Oropharynx is clear and moist. No oropharyngeal exudate.  Nasal skin lesion . ear cerumen left ear  Eyes: Pupils are equal, round, and reactive to light. Conjunctivae and EOM are normal. Right eye exhibits no discharge. Left eye  exhibits no discharge. No scleral icterus.  Neck: Normal range of motion. Neck supple. No thyromegaly present.  No adenopathy palpated  Cardiovascular: Normal rate, regular rhythm and normal heart sounds.   No murmur heard. Distal pulses were difficult to palpate Heart is regular today at 72/m  Pulmonary/Chest: Effort normal and breath sounds normal. No respiratory distress. She has no wheezes. She has no rales.  Clear anteriorly and posteriorly  Abdominal: Soft. Bowel sounds are normal. She exhibits no mass. There is no tenderness. There is no rebound and no guarding.  Patient is wearing depends there is no masses or organ enlargement or bruits palpable. No suprapubic tenderness.  Musculoskeletal: She exhibits no edema.  The patient uses a walker for ambulation and she uses is constantly at home.  Lymphadenopathy:    She has no cervical adenopathy.  Neurological: She is alert and oriented to person, place, and time.  Skin: Skin is warm and dry. No rash noted.  Lesion of uncertain behavior right side of nose  Psychiatric: She has a normal mood and affect. Her behavior is normal. Judgment and thought content normal.  Nursing note and vitals reviewed.  BP (!) 172/76 (BP Location: Right Arm)   Pulse 64   Temp (!) 97.3 F (36.3 C) (Oral)   Ht 5' (1.524 m)   Wt 108 lb (49 kg)   BMI 21.09 kg/m   General ear irrigation to remove cerumen from left ear canal      Assessment & Plan:  1. Essential hypertension -The systolic blood pressure was elevated today. No change in treatment because of her age and the stress of getting her to the office. - BMP8+EGFR - CBC with Differential/Platelet - Hepatic function panel  2. Pure hypercholesterolemia -Continue with healthy eating habits - CBC with Differential/Platelet - Lipid panel  3. Vitamin D deficiency -And 10 you with vitamin D replacement - CBC with Differential/Platelet - VITAMIN D 25 Hydroxy (Vit-D Deficiency, Fractures)  4.  Persistent atrial fibrillation (HCC) -The heart appeared to have a regular rate and rhythm today and was not in atrophia today. - CBC with Differential/Platelet  5. Type 2 diabetes mellitus without complication, without long-term current use of insulin (HCC) -According to the son the blood sugars at home have been running according to the past. Those past hemoglobin A1c's were good at 6.9% - CBC with Differential/Platelet - Microalbumin / creatinine urine ratio - Bayer DCA Hb A1c Waived  6. Skin lesion of face - Ambulatory referral to Dermatology  7. Excessive cerumen in ear canal, bilateral -Gentle ear irrigation to remove cerumen in left ear canal  8. Peripheral vascular insufficiency (HCC) -Diminished pulses in both feet  Meds ordered this encounter  Medications  . apixaban (ELIQUIS) 2.5 MG TABS tablet    Sig: TAKE (1) TABLET TWICE A DAY.    Dispense:  60 tablet    Refill:  11  . metFORMIN (GLUCOPHAGE-XR) 500 MG 24 hr tablet    Sig: TAKE 1 TABLET ONCE DAILY WITH LARGEST  MEAL.    Dispense:  90 tablet    Refill:  3  . sertraline (ZOLOFT) 100 MG tablet    Sig: Take 1 tablet (100 mg total) by mouth daily.    Dispense:  30 tablet    Refill:  11  . diltiazem (CARDIZEM CD) 240 MG 24 hr capsule    Sig: TAKE (1) CAPSULE DAILY    Dispense:  30 capsule    Refill:  11   Patient Instructions                       Medicare Annual Wellness Visit  [facility] and the medical providers at [facility] strive to bring you the best medical care.  In doing so we not only want to address your current medical conditions and concerns but also to detect new conditions early and prevent illness, disease and health-related problems.    Medicare offers a yearly Wellness Visit which allows our clinical staff to assess your need for preventative services including immunizations, lifestyle education, counseling to decrease risk of preventable diseases and screening for fall risk  and other medical concerns.    This visit is provided free of charge (no copay) for all Medicare recipients. The clinical pharmacists at [facility] have begun to conduct these Wellness Visits which will also include a thorough review of all your medications.    As you primary medical provider recommend that you make an appointment for your Annual Wellness Visit if you have not done so already this year.  You may set up this appointment before you leave today or you may call back ([phone]) and schedule an appointment.  Please make sure when you call that you mention that you are scheduling your Annual Wellness Visit with the clinical pharmacist so that the appointment may be made for the proper length of time.     Continue current medications. Continue good therapeutic lifestyle changes which include good diet and exercise. Fall precautions discussed with patient. If an FOBT was given today- please return it to our front desk. If you are over 77 years old - you may need Prevnar 9 or the adult Pneumonia vaccine.  **Flu shots are available--- please call and schedule a FLU-CLINIC appointment**  After your visit with [place] today you will receive a survey in the mail or online from [employer] regarding your care with [place]. Please take a moment to fill this out. Your feedback is very important to [place] as you can help [place] better understand your patient needs as well as improve your experience and satisfaction. WE CARE ABOUT YOU!!!   Use walker regularly and did not put yourself at risk for falling Continue to drink plenty of fluids and stay well hydrated We will arrange for you to see the dermatologist for the skin lesion on the right side of your nose and our office will call with an appointment    Arrie Senate MD

## 2017-03-29 NOTE — Patient Instructions (Addendum)
Medicare Annual Wellness Visit  [facility] and the medical providers at [facility] strive to bring you the best medical care.  In doing so we not only want to address your current medical conditions and concerns but also to detect new conditions early and prevent illness, disease and health-related problems.    Medicare offers a yearly Wellness Visit which allows our clinical staff to assess your need for preventative services including immunizations, lifestyle education, counseling to decrease risk of preventable diseases and screening for fall risk and other medical concerns.    This visit is provided free of charge (no copay) for all Medicare recipients. The clinical pharmacists at [facility] have begun to conduct these Wellness Visits which will also include a thorough review of all your medications.    As you primary medical provider recommend that you make an appointment for your Annual Wellness Visit if you have not done so already this year.  You may set up this appointment before you leave today or you may call back ([phone]) and schedule an appointment.  Please make sure when you call that you mention that you are scheduling your Annual Wellness Visit with the clinical pharmacist so that the appointment may be made for the proper length of time.     Continue current medications. Continue good therapeutic lifestyle changes which include good diet and exercise. Fall precautions discussed with patient. If an FOBT was given today- please return it to our front desk. If you are over 66 years old - you may need Prevnar 82 or the adult Pneumonia vaccine.  **Flu shots are available--- please call and schedule a FLU-CLINIC appointment**  After your visit with [place] today you will receive a survey in the mail or online from [employer] regarding your care with [place]. Please take a moment to fill this out. Your feedback is very  important to [place] as you can help [place] better understand your patient needs as well as improve your experience and satisfaction. WE CARE ABOUT YOU!!!   Use walker regularly and did not put yourself at risk for falling Continue to drink plenty of fluids and stay well hydrated We will arrange for you to see the dermatologist for the skin lesion on the right side of your nose and our office will call with an appointment

## 2017-03-30 LAB — CBC WITH DIFFERENTIAL/PLATELET
BASOS: 0 %
Basophils Absolute: 0 10*3/uL (ref 0.0–0.2)
EOS (ABSOLUTE): 0.1 10*3/uL (ref 0.0–0.4)
EOS: 1 %
HEMOGLOBIN: 11.3 g/dL (ref 11.1–15.9)
Hematocrit: 33.9 % — ABNORMAL LOW (ref 34.0–46.6)
IMMATURE GRANS (ABS): 0 10*3/uL (ref 0.0–0.1)
Immature Granulocytes: 0 %
LYMPHS ABS: 1.5 10*3/uL (ref 0.7–3.1)
Lymphs: 16 %
MCH: 28.5 pg (ref 26.6–33.0)
MCHC: 33.3 g/dL (ref 31.5–35.7)
MCV: 85 fL (ref 79–97)
MONOS ABS: 0.5 10*3/uL (ref 0.1–0.9)
Monocytes: 5 %
Neutrophils Absolute: 7.8 10*3/uL — ABNORMAL HIGH (ref 1.4–7.0)
Neutrophils: 78 %
PLATELETS: 272 10*3/uL (ref 150–379)
RBC: 3.97 x10E6/uL (ref 3.77–5.28)
RDW: 14.9 % (ref 12.3–15.4)
WBC: 9.9 10*3/uL (ref 3.4–10.8)

## 2017-03-30 LAB — BMP8+EGFR
BUN / CREAT RATIO: 18 (ref 12–28)
BUN: 21 mg/dL (ref 10–36)
CO2: 22 mmol/L (ref 20–29)
CREATININE: 1.18 mg/dL — AB (ref 0.57–1.00)
Calcium: 9.9 mg/dL (ref 8.7–10.3)
Chloride: 102 mmol/L (ref 96–106)
GFR calc Af Amer: 45 mL/min/{1.73_m2} — ABNORMAL LOW (ref >59)
GFR, EST NON AFRICAN AMERICAN: 39 mL/min/{1.73_m2} — AB (ref >59)
GLUCOSE: 124 mg/dL — AB (ref 65–99)
Potassium: 4.9 mmol/L (ref 3.5–5.2)
SODIUM: 141 mmol/L (ref 134–144)

## 2017-03-30 LAB — LIPID PANEL
Chol/HDL Ratio: 4.4 ratio (ref 0.0–4.4)
Cholesterol, Total: 215 mg/dL — ABNORMAL HIGH (ref 100–199)
HDL: 49 mg/dL (ref >39)
LDL CALC: 131 mg/dL — AB (ref 0–99)
Triglycerides: 175 mg/dL — ABNORMAL HIGH (ref 0–149)
VLDL CHOLESTEROL CAL: 35 mg/dL (ref 5–40)

## 2017-03-30 LAB — HEPATIC FUNCTION PANEL
ALBUMIN: 4.5 g/dL (ref 3.2–4.6)
ALK PHOS: 81 IU/L (ref 39–117)
ALT: 8 IU/L (ref 0–32)
AST: 18 IU/L (ref 0–40)
BILIRUBIN, DIRECT: 0.06 mg/dL (ref 0.00–0.40)
TOTAL PROTEIN: 7.1 g/dL (ref 6.0–8.5)

## 2017-03-30 LAB — VITAMIN D 25 HYDROXY (VIT D DEFICIENCY, FRACTURES): Vit D, 25-Hydroxy: 16.9 ng/mL — ABNORMAL LOW (ref 30.0–100.0)

## 2017-06-23 ENCOUNTER — Other Ambulatory Visit: Payer: Self-pay | Admitting: *Deleted

## 2017-06-23 ENCOUNTER — Other Ambulatory Visit: Payer: Medicare Other

## 2017-06-23 DIAGNOSIS — R300 Dysuria: Secondary | ICD-10-CM | POA: Diagnosis not present

## 2017-06-23 LAB — URINALYSIS, COMPLETE
Bilirubin, UA: NEGATIVE
Glucose, UA: NEGATIVE
Nitrite, UA: POSITIVE — AB
PH UA: 5 (ref 5.0–7.5)
Specific Gravity, UA: 1.025 (ref 1.005–1.030)
UUROB: 0.2 mg/dL (ref 0.2–1.0)

## 2017-06-23 LAB — MICROSCOPIC EXAMINATION

## 2017-06-23 MED ORDER — SULFAMETHOXAZOLE-TRIMETHOPRIM 800-160 MG PO TABS: 1.0000 | ORAL_TABLET | Freq: Two times a day (BID) | ORAL | 0 refills | Status: DC

## 2017-06-25 LAB — URINE CULTURE

## 2017-09-21 ENCOUNTER — Ambulatory Visit (INDEPENDENT_AMBULATORY_CARE_PROVIDER_SITE_OTHER): Payer: Medicare Other | Admitting: Family Medicine

## 2017-09-21 ENCOUNTER — Encounter: Payer: Self-pay | Admitting: Family Medicine

## 2017-09-21 VITALS — BP 183/63 | HR 76 | Temp 97.1°F | Ht 60.0 in | Wt 102.0 lb

## 2017-09-21 DIAGNOSIS — I10 Essential (primary) hypertension: Secondary | ICD-10-CM

## 2017-09-21 DIAGNOSIS — R54 Age-related physical debility: Secondary | ICD-10-CM

## 2017-09-21 DIAGNOSIS — E7800 Pure hypercholesterolemia, unspecified: Secondary | ICD-10-CM | POA: Diagnosis not present

## 2017-09-21 DIAGNOSIS — H6123 Impacted cerumen, bilateral: Secondary | ICD-10-CM

## 2017-09-21 DIAGNOSIS — I739 Peripheral vascular disease, unspecified: Secondary | ICD-10-CM

## 2017-09-21 DIAGNOSIS — R300 Dysuria: Secondary | ICD-10-CM

## 2017-09-21 DIAGNOSIS — I4819 Other persistent atrial fibrillation: Secondary | ICD-10-CM

## 2017-09-21 DIAGNOSIS — I481 Persistent atrial fibrillation: Secondary | ICD-10-CM | POA: Diagnosis not present

## 2017-09-21 DIAGNOSIS — Z85828 Personal history of other malignant neoplasm of skin: Secondary | ICD-10-CM

## 2017-09-21 DIAGNOSIS — E119 Type 2 diabetes mellitus without complications: Secondary | ICD-10-CM | POA: Diagnosis not present

## 2017-09-21 DIAGNOSIS — E559 Vitamin D deficiency, unspecified: Secondary | ICD-10-CM | POA: Diagnosis not present

## 2017-09-21 DIAGNOSIS — Z87440 Personal history of urinary (tract) infections: Secondary | ICD-10-CM | POA: Diagnosis not present

## 2017-09-21 LAB — URINALYSIS, COMPLETE
BILIRUBIN UA: NEGATIVE
GLUCOSE, UA: NEGATIVE
KETONES UA: NEGATIVE
LEUKOCYTES UA: NEGATIVE
NITRITE UA: NEGATIVE
RBC UA: NEGATIVE
SPEC GRAV UA: 1.02 (ref 1.005–1.030)
Urobilinogen, Ur: 0.2 mg/dL (ref 0.2–1.0)
pH, UA: 6 (ref 5.0–7.5)

## 2017-09-21 LAB — MICROSCOPIC EXAMINATION: RBC, UA: NONE SEEN /hpf (ref 0–?)

## 2017-09-21 LAB — BAYER DCA HB A1C WAIVED: HB A1C: 6.1 % (ref <7.0)

## 2017-09-21 MED ORDER — SULFAMETHOXAZOLE-TRIMETHOPRIM 800-160 MG PO TABS: 1.0000 | ORAL_TABLET | Freq: Two times a day (BID) | ORAL | 1 refills | Status: DC

## 2017-09-21 NOTE — Progress Notes (Signed)
Subjective:    Patient ID: [name], female    DOB: [birth year], 84 y.o.   MRN: [id number]  HPI Pt here for follow up and management of chronic medical problems which includes hypertension and hyperlipidemia. She is taking medication regularly.  [name] is doing well overall.  She comes to the visit today with her son and caregiver.  She is due to get lab work today return in FOBT and get a chest x-ray.  She refuses routine women's care issues.  The patient is elderly but alert.  She refuses to do anything about the basal cell cancer on her nose.  The son states it is a major effort to get her to the office and would like to reduce the frequency of visits and when she does, he would like for it to be in the warmer months of the year and we agreed to this.  She does not want to do an FOBT or get a chest x-ray today.  She denies any symptoms other than just feeling bad from her age.  She denies any chest pain or shortness of breath no trouble with her intestinal tract but has had several bouts with burning with voiding and only takes 1 or 2 Septra and this seems to get better.  We will check a urine today and get her routine blood work and schedule her to come back again sometime in August and then go yearly from that time.    Patient Active Problem List   Diagnosis Date Noted  . Compression fracture   . Proximal humerus fracture 05/29/2014  . Chronic insomnia 04/03/2014  . Vitamin D deficiency 04/03/2014  . Diabetes mellitus ([place]) 08/19/2013  . Atrial fibrillation ([place]) 08/19/2013  . High risk medication use 08/19/2013  . Hypertension 08/19/2013  . Hyperlipidemia 08/19/2013  . CLOSED FRACTURE UNSPECIFIED PART NECK FEMUR, history of 09/14/2009   Outpatient Encounter Medications as of 09/21/2017  Medication Sig  . apixaban (ELIQUIS) 2.5 MG TABS tablet TAKE (1) TABLET TWICE A DAY.  Marland Kitchen diltiazem (CARDIZEM CD) 240 MG 24 hr capsule TAKE (1) CAPSULE DAILY  . metFORMIN (GLUCOPHAGE-XR) 500 MG 24 hr  tablet TAKE 1 TABLET ONCE DAILY WITH LARGEST MEAL.  Marland Kitchen sertraline (ZOLOFT) 100 MG tablet Take 1 tablet (100 mg total) by mouth daily.  . [DISCONTINUED] sulfamethoxazole-trimethoprim (BACTRIM DS) 800-160 MG tablet Take 1 tablet 2 (two) times daily by mouth. With food   No facility-administered encounter medications on file as of 09/21/2017.       Review of Systems  Constitutional: Negative.   HENT: Negative.   Eyes: Negative.   Respiratory: Negative.   Cardiovascular: Negative.   Gastrointestinal: Negative.   Endocrine: Negative.   Genitourinary: Negative.   Musculoskeletal: Negative.   Skin: Negative.   Allergic/Immunologic: Negative.   Neurological: Negative.        Several falls recently  Hematological: Negative.   Psychiatric/Behavioral: Negative.        Objective:   Physical Exam  Constitutional: She is oriented to person, place, and time. No distress.  Elderly but alert and coherent  HENT:  Head: Normocephalic and atraumatic.  Right Ear: External ear normal.  Left Ear: External ear normal.  Mouth/Throat: Oropharynx is clear and moist. No oropharyngeal exudate.  Basal cell skin cancer that continues to get enlarged on the right side of her nose.  She refuses to go to the dermatologist.  Eyes: Conjunctivae and EOM are normal. Pupils are equal, round, and reactive to light. Right  eye exhibits no discharge. Left eye exhibits no discharge. No scleral icterus.  Neck: Normal range of motion. Neck supple. No thyromegaly present.  No thyromegaly or bruits.  Cardiovascular: Normal rate and intact distal pulses.  No murmur heard. The heart is irregular irregular 72/min  Pulmonary/Chest: Effort normal and breath sounds normal. No respiratory distress. She has no wheezes. She has no rales.  Clear anteriorly and posteriorly  Abdominal: Soft. Bowel sounds are normal. She exhibits no mass. There is no tenderness. There is no rebound and no guarding.  The abdomen was soft without  masses tenderness or organ enlargement  Musculoskeletal: She exhibits no edema.  Uses a walker for ambulation  Lymphadenopathy:    She has no cervical adenopathy.  Neurological: She is alert and oriented to person, place, and time.  Skin: Skin is warm and dry. No rash noted.  Enlarging basal cell carcinoma right side of nose  Psychiatric: She has a normal mood and affect. Her behavior is normal. Judgment and thought content normal.  Nursing note and vitals reviewed.  BP (!) 183/63 (BP Location: Left Arm)   Pulse 76   Temp (!) 97.1 F (36.2 C) (Oral)   Ht 5' (1.524 m)   Wt 102 lb (46.3 kg)   BMI 19.92 kg/m         Assessment & Plan:  1. Essential hypertension -Blood pressure is good today except the systolic is elevated but there will be no changes in treatment. - BMP8+EGFR - CBC with Differential/Platelet - Hepatic function panel  2. Pure hypercholesterolemia -Continue with as aggressive therapeutic lifestyle changes as possible - CBC with Differential/Platelet - Lipid panel  3. Vitamin D deficiency -Continue with vitamin D replacement pending results of lab work - CBC with Differential/Platelet - VITAMIN D 25 Hydroxy (Vit-D Deficiency, Fractures)  4. Persistent atrial fibrillation ([place]) -Patient remains in atrial fibrillation and is still taking Eliquis. - CBC with Differential/Platelet  5. Type 2 diabetes mellitus without complication, without long-term current use of insulin (HCC) -No history of any blood sugar checks at home but they do check these periodically. - CBC with Differential/Platelet - Bayer DCA Hb A1c Waived  6. Peripheral vascular insufficiency (HCC) -Actually today good dorsalis pedis pulses were felt on each foot.  7. Recent urinary tract infection -She has had bouts with burning and passing her water but never takes a full course of antibiotics.  We will check a urine today and do a culture and sensitivity and refill her sulfa medicine. -  Urine Culture - Urinalysis, Complete  8. Frailty syndrome in geriatric patient -Patient was encouraged to continue to use her walker and additional support as needed  9. Dysuria -Urinalysis today  10. Hx of skin cancer, basal cell -Continues to refuse to go see the dermatologist.  11. Excessive cerumen in ear canal, bilateral -Debrox to soften earwax.  Meds ordered this encounter  Medications  . sulfamethoxazole-trimethoprim (BACTRIM DS) 800-160 MG tablet    Sig: Take 1 tablet by mouth 2 (two) times daily. With food    Dispense:  14 tablet    Refill:  1    Deliver to pt   Patient Instructions                       Medicare Annual Wellness Visit  [facility] and the medical providers at [facility] strive to bring you the best medical care.  In doing so we not only want to address your  current medical conditions and concerns but also to detect new conditions early and prevent illness, disease and health-related problems.    Medicare offers a yearly Wellness Visit which allows our clinical staff to assess your need for preventative services including immunizations, lifestyle education, counseling to decrease risk of preventable diseases and screening for fall risk and other medical concerns.    This visit is provided free of charge (no copay) for all Medicare recipients. The clinical pharmacists at [facility] have begun to conduct these Wellness Visits which will also include a thorough review of all your medications.    As you primary medical provider recommend that you make an appointment for your Annual Wellness Visit if you have not done so already this year.  You may set up this appointment before you leave today or you may call back ([phone]) and schedule an appointment.  Please make sure when you call that you mention that you are scheduling your Annual Wellness Visit with the clinical pharmacist so that the appointment may be made  for the proper length of time.     Continue current medications. Continue good therapeutic lifestyle changes which include good diet and exercise. Fall precautions discussed with patient. If an FOBT was given today- please return it to our front desk. If you are over 51 years old - you may need Prevnar 24 or the adult Pneumonia vaccine.  **Flu shots are available--- please call and schedule a FLU-CLINIC appointment**  After your visit with [place] today you will receive a survey in the mail or online from [employer] regarding your care with [place]. Please take a moment to fill this out. Your feedback is very important to [place] as you can help [place] better understand your patient needs as well as improve your experience and satisfaction. WE CARE ABOUT YOU!!!   Continue to drink plenty of fluids and stay well-hydrated Use the walker constantly and also have someone supporting you at the same time If you decide to go to do anything about the basal cell skin cancer of the nose please let us know We will call your son with the results of the blood work as soon as these results become available Debrox eardrops can be used to soften earwax in each year Mikki Santee instilling 2-3 drops nightly for 2-3 nights in a row and repeating this in 1 week.    Arrie Senate MD

## 2017-09-21 NOTE — Patient Instructions (Addendum)
Medicare Annual Wellness Visit  [facility] and the medical providers at [facility] strive to bring you the best medical care.  In doing so we not only want to address your current medical conditions and concerns but also to detect new conditions early and prevent illness, disease and health-related problems.    Medicare offers a yearly Wellness Visit which allows our clinical staff to assess your need for preventative services including immunizations, lifestyle education, counseling to decrease risk of preventable diseases and screening for fall risk and other medical concerns.    This visit is provided free of charge (no copay) for all Medicare recipients. The clinical pharmacists at [facility] have begun to conduct these Wellness Visits which will also include a thorough review of all your medications.    As you primary medical provider recommend that you make an appointment for your Annual Wellness Visit if you have not done so already this year.  You may set up this appointment before you leave today or you may call back ([phone]) and schedule an appointment.  Please make sure when you call that you mention that you are scheduling your Annual Wellness Visit with the clinical pharmacist so that the appointment may be made for the proper length of time.     Continue current medications. Continue good therapeutic lifestyle changes which include good diet and exercise. Fall precautions discussed with patient. If an FOBT was given today- please return it to our front desk. If you are over 39 years old - you may need Prevnar 39 or the adult Pneumonia vaccine.  **Flu shots are available--- please call and schedule a FLU-CLINIC appointment**  After your visit with [place] today you will receive a survey in the mail or online from [employer] regarding your care with [place]. Please take a moment to fill this out. Your feedback is very  important to [place] as you can help [place] better understand your patient needs as well as improve your experience and satisfaction. WE CARE ABOUT YOU!!!   Continue to drink plenty of fluids and stay well-hydrated Use the walker constantly and also have someone supporting you at the same time If you decide to go to do anything about the basal cell skin cancer of the nose please let us know We will call your son with the results of the blood work as soon as these results become available Debrox eardrops can be used to soften earwax in each year Mikki Santee instilling 2-3 drops nightly for 2-3 nights in a row and repeating this in 1 week.

## 2017-09-22 ENCOUNTER — Encounter: Payer: Self-pay | Admitting: Family Medicine

## 2017-09-22 DIAGNOSIS — I1310 Hypertensive heart and chronic kidney disease without heart failure, with stage 1 through stage 4 chronic kidney disease, or unspecified chronic kidney disease: Secondary | ICD-10-CM | POA: Insufficient documentation

## 2017-09-22 DIAGNOSIS — N183 Chronic kidney disease, stage 3 (moderate): Secondary | ICD-10-CM

## 2017-09-22 LAB — HEPATIC FUNCTION PANEL
ALBUMIN: 4.6 g/dL (ref 3.2–4.6)
ALK PHOS: 87 IU/L (ref 39–117)
ALT: 12 IU/L (ref 0–32)
AST: 18 IU/L (ref 0–40)
Bilirubin Total: 0.3 mg/dL (ref 0.0–1.2)
Bilirubin, Direct: 0.09 mg/dL (ref 0.00–0.40)
TOTAL PROTEIN: 7.2 g/dL (ref 6.0–8.5)

## 2017-09-22 LAB — BMP8+EGFR
BUN / CREAT RATIO: 12 (ref 12–28)
BUN: 13 mg/dL (ref 10–36)
CHLORIDE: 101 mmol/L (ref 96–106)
CO2: 24 mmol/L (ref 20–29)
Calcium: 10.1 mg/dL (ref 8.7–10.3)
Creatinine, Ser: 1.13 mg/dL — ABNORMAL HIGH (ref 0.57–1.00)
GFR calc Af Amer: 47 mL/min/{1.73_m2} — ABNORMAL LOW (ref >59)
GFR calc non Af Amer: 41 mL/min/{1.73_m2} — ABNORMAL LOW (ref >59)
GLUCOSE: 128 mg/dL — AB (ref 65–99)
POTASSIUM: 4.2 mmol/L (ref 3.5–5.2)
SODIUM: 143 mmol/L (ref 134–144)

## 2017-09-22 LAB — CBC WITH DIFFERENTIAL/PLATELET
BASOS: 0 %
Basophils Absolute: 0 10*3/uL (ref 0.0–0.2)
EOS (ABSOLUTE): 0 10*3/uL (ref 0.0–0.4)
EOS: 0 %
HEMATOCRIT: 31.3 % — AB (ref 34.0–46.6)
HEMOGLOBIN: 10.4 g/dL — AB (ref 11.1–15.9)
Immature Grans (Abs): 0 10*3/uL (ref 0.0–0.1)
Immature Granulocytes: 0 %
Lymphocytes Absolute: 0.6 10*3/uL — ABNORMAL LOW (ref 0.7–3.1)
Lymphs: 6 %
MCH: 26.5 pg — ABNORMAL LOW (ref 26.6–33.0)
MCHC: 33.2 g/dL (ref 31.5–35.7)
MCV: 80 fL (ref 79–97)
MONOCYTES: 3 %
MONOS ABS: 0.3 10*3/uL (ref 0.1–0.9)
NEUTROS ABS: 9.3 10*3/uL — AB (ref 1.4–7.0)
Neutrophils: 91 %
Platelets: 274 10*3/uL (ref 150–379)
RBC: 3.93 x10E6/uL (ref 3.77–5.28)
RDW: 14.7 % (ref 12.3–15.4)
WBC: 10.3 10*3/uL (ref 3.4–10.8)

## 2017-09-22 LAB — LIPID PANEL
CHOL/HDL RATIO: 3.6 ratio (ref 0.0–4.4)
Cholesterol, Total: 199 mg/dL (ref 100–199)
HDL: 55 mg/dL (ref >39)
LDL CALC: 120 mg/dL — AB (ref 0–99)
TRIGLYCERIDES: 119 mg/dL (ref 0–149)
VLDL Cholesterol Cal: 24 mg/dL (ref 5–40)

## 2017-09-22 LAB — VITAMIN D 25 HYDROXY (VIT D DEFICIENCY, FRACTURES): Vit D, 25-Hydroxy: 20.6 ng/mL — ABNORMAL LOW (ref 30.0–100.0)

## 2017-09-22 LAB — URINE CULTURE: ORGANISM ID, BACTERIA: NO GROWTH

## 2017-10-11 ENCOUNTER — Telehealth (HOSPITAL_COMMUNITY): Payer: Self-pay | Admitting: Radiology

## 2017-10-11 ENCOUNTER — Ambulatory Visit (HOSPITAL_COMMUNITY)
Admission: RE | Admit: 2017-10-11 | Discharge: 2017-10-11 | Disposition: A | Discharge status: Home / Self Care | Payer: Medicare Other | Source: Ambulatory Visit | Attending: Family Medicine | Admitting: Family Medicine

## 2017-10-11 ENCOUNTER — Telehealth: Payer: Self-pay | Admitting: *Deleted

## 2017-10-11 DIAGNOSIS — M4856XA Collapsed vertebra, not elsewhere classified, lumbar region, initial encounter for fracture: Secondary | ICD-10-CM | POA: Insufficient documentation

## 2017-10-11 DIAGNOSIS — M549 Dorsalgia, unspecified: Secondary | ICD-10-CM

## 2017-10-11 DIAGNOSIS — M5136 Other intervertebral disc degeneration, lumbar region: Secondary | ICD-10-CM | POA: Diagnosis not present

## 2017-10-11 DIAGNOSIS — S3992XA Unspecified injury of lower back, initial encounter: Principal | ICD-10-CM

## 2017-10-11 DIAGNOSIS — M48061 Spinal stenosis, lumbar region without neurogenic claudication: Secondary | ICD-10-CM | POA: Insufficient documentation

## 2017-10-11 DIAGNOSIS — M5126 Other intervertebral disc displacement, lumbar region: Secondary | ICD-10-CM | POA: Diagnosis not present

## 2017-10-11 DIAGNOSIS — M4854XA Collapsed vertebra, not elsewhere classified, thoracic region, initial encounter for fracture: Secondary | ICD-10-CM | POA: Diagnosis not present

## 2017-10-11 DIAGNOSIS — M5125 Other intervertebral disc displacement, thoracolumbar region: Secondary | ICD-10-CM | POA: Diagnosis not present

## 2017-10-11 NOTE — Telephone Encounter (Signed)
Pt has had multiple falls Severe back pain Per Dr Laurance Flatten MRI ordered

## 2017-10-11 NOTE — Telephone Encounter (Signed)
Pt's daughter-in-law called stating that the pt has new onset of severe low back pain. They feel like she has another compression fracture in her low back due to her history of such. Dr. Estanislado Pandy has not seen this patient since 2016 and I advised the daughter-in-law to contact the patient's PCP to see if he would order an MRI for the patient. She states she will call Dr. Laurance Flatten and call us back. JM

## 2017-10-12 ENCOUNTER — Other Ambulatory Visit: Payer: Self-pay | Admitting: Family Medicine

## 2017-10-12 DIAGNOSIS — S22000B Wedge compression fracture of unspecified thoracic vertebra, initial encounter for open fracture: Secondary | ICD-10-CM

## 2017-10-12 DIAGNOSIS — M545 Low back pain, unspecified: Secondary | ICD-10-CM

## 2017-10-18 ENCOUNTER — Other Ambulatory Visit (HOSPITAL_COMMUNITY): Payer: Self-pay | Admitting: Interventional Radiology

## 2017-10-18 ENCOUNTER — Encounter: Payer: Self-pay | Admitting: Radiology

## 2017-10-18 ENCOUNTER — Ambulatory Visit
Admission: RE | Admit: 2017-10-18 | Discharge: 2017-10-18 | Disposition: A | Discharge status: Home / Self Care | Payer: Medicare Other | Source: Ambulatory Visit | Attending: Family Medicine | Admitting: Family Medicine

## 2017-10-18 ENCOUNTER — Telehealth: Payer: Self-pay | Admitting: *Deleted

## 2017-10-18 DIAGNOSIS — S22000B Wedge compression fracture of unspecified thoracic vertebra, initial encounter for open fracture: Secondary | ICD-10-CM

## 2017-10-18 DIAGNOSIS — S22060A Wedge compression fracture of T7-T8 vertebra, initial encounter for closed fracture: Secondary | ICD-10-CM | POA: Diagnosis not present

## 2017-10-18 DIAGNOSIS — M546 Pain in thoracic spine: Secondary | ICD-10-CM

## 2017-10-18 DIAGNOSIS — M4850XA Collapsed vertebra, not elsewhere classified, site unspecified, initial encounter for fracture: Secondary | ICD-10-CM

## 2017-10-18 HISTORY — PX: IR RADIOLOGIST EVAL & MGMT: IMG5224

## 2017-10-18 NOTE — Consult Note (Signed)
Chief Complaint: T8 Compression Fracture  Referring Physician(s): Moore,Donald W  History of Present Illness: [name] is a 84 y.o. female with past medical history significant for stroke, diabetes, hypertension and atrial fibrillation (currently on Eliquis) who presents to the interventional radiology clinic for evaluation of percutaneous treatment of T8 compression fracture. The patient is accompanied by her son and daughter-in-law who serve as her primary historian as she is severely hard of hearing.  Patient has undergone multiple previous kyphoplasty procedures, most recently the T6 vertebroplasty by Dr. Geroge Baseman on 12/05/2014.  Patient was in her usual state of health without significant back pain until approximately 10 days ago at which time she developed acute onset of severe mid thoracic back pain. She describes the pain as similar to her previous compression fractures.  Patient currently rates her pain as 10 / 10, worse with any movement or activity. She denies radicular symptoms. No change in bladder or bowel function.  Patient otherwise without complaint. Specifically, no chest pain, shortness of breath, fever or chills.  Past Medical History:  Diagnosis Date  . Atrial fibrillation ([place])   . Basal cell cancer    right shoulder  . Diabetes mellitus   . Fatigue   . Hypertension   . Kyphosis     Past Surgical History:  Procedure Laterality Date  . BONE BIOPSY     x 5  . EYE SURGERY Right   . HIP FRACTURE SURGERY Bilateral   . IR RADIOLOGIST EVAL & MGMT  10/18/2017    Allergies: Ace inhibitors; Codeine; and Vytorin [ezetimibe-simvastatin]  Medications: Prior to Admission medications   Medication Sig Start Date End Date Taking? Authorizing Provider  acetaminophen (TYLENOL) 500 MG tablet Take 1,000 mg by mouth every 4 (four) hours as needed.   Yes [provider]  apixaban (ELIQUIS) 2.5 MG TABS tablet TAKE (1) TABLET TWICE A DAY. 03/29/17  Yes  Chipper Herb, MD  diltiazem [facility] CD) 240 MG 24 hr capsule TAKE (1) CAPSULE DAILY 03/29/17  Yes Chipper Herb, MD  metFORMIN (GLUCOPHAGE-XR) 500 MG 24 hr tablet TAKE 1 TABLET ONCE DAILY WITH LARGEST MEAL. 03/29/17  Yes Chipper Herb, MD  sertraline (ZOLOFT) 100 MG tablet Take 1 tablet (100 mg total) by mouth daily. 03/29/17  Yes Chipper Herb, MD  sulfamethoxazole-trimethoprim (BACTRIM DS) 800-160 MG tablet Take 1 tablet by mouth 2 (two) times daily. With food Patient not taking: Reported on 10/18/2017 09/21/17   Chipper Herb, MD     Family History  Problem Relation Age of Onset  . Diabetes Mother   . Heart disease Mother     Social History   Socioeconomic History  . Marital status: Widowed    Spouse name: Not on file  . Number of children: Not on file  . Years of education: Not on file  . Highest education level: Not on file  Social Needs  . Financial resource strain: Not on file  . Food insecurity - worry: Not on file  . Food insecurity - inability: Not on file  . Transportation needs - medical: Not on file  . Transportation needs - non-medical: Not on file  Occupational History  . Not on file  Tobacco Use  . Smoking status: Never Smoker  . Smokeless tobacco: Never Used  Substance and Sexual Activity  . Alcohol use: No  . Drug use: No  . Sexual activity: Not on file  Other Topics Concern  . Not on file  Social History Narrative  . Not on file    ECOG Status: 3 - Symptomatic, >50% confined to bed  Review of Systems: A 12 point ROS discussed and pertinent positives are indicated in the HPI above.  All other systems are negative.  Review of Systems  Constitutional: Positive for appetite change. Negative for fever.  Respiratory: Negative.   Cardiovascular: Negative.   Gastrointestinal: Negative.   Musculoskeletal: Positive for back pain.    Vital Signs: BP (!) 175/95   Pulse (!) 107   Temp 97.6 F (36.4 C) (Oral)   Resp 14   Ht 5' (1.524 m)   Wt  100 lb (45.4 kg)   SpO2 96%   BMI 19.53 kg/m   Physical Exam  Constitutional: She appears well-developed and well-nourished.  HENT:  Head: Normocephalic and atraumatic.  Musculoskeletal:       Arms: Approximate location of patient's back pain.  Nursing note and vitals reviewed.   Imaging:  Selected images from thoracic spine MRI performed 10/11/2017 were reviewed with the patient and the patient's family.  Mr Thoracic Spine Wo Contrast  Result Date: 10/11/2017 CLINICAL DATA:  Chronic mid and lower back pain. Prior compression fractures. EXAM: MRI THORACIC SPINE WITHOUT CONTRAST TECHNIQUE: Multiplanar, multisequence MR imaging of the thoracic spine was performed. No intravenous contrast was administered. COMPARISON:  12/01/2014 FINDINGS: Alignment: Exaggerated thoracic kyphosis. Grade 1 anterolisthesis of C7 on T1, unchanged. Vertebrae: Interval augmentation of T6 vertebral compression fracture. Chronic T7, T9, and T12 compression fractures status post augmentation. New marrow edema superiorly in the T8 vertebral body with mildly progressive vertebral body height loss, now 45%. No suspicious osseous lesion. Cord:  Normal signal and morphology. Paraspinal and other soft tissues: Unremarkable. Disc levels: Diffuse lower cervical spine disc degeneration is only imaged sagittally with disc bulging noted at C5-6 and C6-7 resulting in suspected mild spinal stenosis. There is mild diffuse thoracic disc degeneration including a tiny central disc protrusion at T2-3 without resultant spinal stenosis or spinal cord mass effect. Mild thoracic facet arthrosis is noted without evidence of high-grade neural foraminal stenosis. IMPRESSION: Acute on chronic T8 compression fracture with 45% height loss. Electronically Signed   By: Logan Bores M.D.   On: 10/11/2017 11:49   Mr Lumbar Spine Wo Contrast  Result Date: 10/11/2017 CLINICAL DATA:  Mid and lower back pain. History of compression fractures. EXAM: MRI  LUMBAR SPINE WITHOUT CONTRAST TECHNIQUE: Multiplanar, multisequence MR imaging of the lumbar spine was performed. No intravenous contrast was administered. COMPARISON:  Thoracic spine MRI 12/01/2014. Lumbar spine MRI 10/06/2010. FINDINGS: The study is motion degraded, severely so on the axial T2 sequence. Segmentation: Standard. Alignment: Lumbar dextroscoliosis. Trace retrolisthesis of L2 on L3 and L3 on L4. Vertebrae: Chronic T12, L2, and L3 compression fracture status post augmentation. Unchanged chronic L4 inferior endplate compression fracture. No evidence of acute fracture. No suspicious osseous lesion. Conus medullaris and cauda equina: Conus extends to the lower T12 level. Conus and cauda equina appear normal. Paraspinal and other soft tissues: No significant abnormality identified. Disc levels: Disc desiccation throughout the lumbar spine. Chronic moderate disc space narrowing at L4-5 and L5-S1. T12-L1: Minimal disc bulging without stenosis. L1-2: Minimal disc bulging without stenosis. L2-3: Mild disc bulging without stenosis. L3-4: Disc bulging and mild facet and ligamentum flavum hypertrophy result in mild bilateral lateral recess stenosis and moderate right neural foraminal stenosis, similar to prior. L4-5: Prior left laminectomy. Disc bulging, endplate spurring, disc space height loss, and mild facet hypertrophy result in  mild bilateral lateral recess stenosis and moderate bilateral neural foraminal stenosis, similar to prior. L5-S1: Mild disc bulging, small central disc protrusion, and mild facet hypertrophy result in borderline left lateral recess stenosis without spinal or neural foraminal stenosis, similar to prior. IMPRESSION: 1. Chronic lumbar compression fractures without acute fracture. 2. Similar appearance of diffuse lumbar disc degeneration within limitations of motion artifact. Mild lateral recess and moderate neural foraminal stenosis at L3-4 and L4-5. Electronically Signed   By: Logan Bores M.D.   On: 10/11/2017 12:01   Ir Radiologist Eval & Mgmt  Result Date: 10/18/2017 Please refer to notes tab for details about interventional procedure. (Op Note)   Labs:  CBC: Recent Labs    03/29/17 1520 09/21/17 1109  WBC 9.9 10.3  HGB 11.3 10.4*  HCT 33.9* 31.3*  PLT 272 274    COAGS: No results for input(s): INR, APTT in the last 8760 hours.  BMP: Recent Labs    03/29/17 1520 09/21/17 1109  NA 141 143  K 4.9 4.2  CL 102 101  CO2 22 24  GLUCOSE 124* 128*  BUN 21 13  CALCIUM 9.9 10.1  CREATININE 1.18* 1.13*  GFRNONAA 39* 41*  GFRAA 45* 47*    LIVER FUNCTION TESTS: Recent Labs    03/29/17 1520 09/21/17 1109  BILITOT <0.2 0.3  AST 18 18  ALT 8 12  ALKPHOS 81 87  PROT 7.1 7.2  ALBUMIN 4.5 4.6    TUMOR MARKERS: No results for input(s): AFPTM, CEA, CA199, CHROMGRNA in the last 8760 hours.  Assessment and Plan:  [name] is a 84 y.o. female with past medical history significant for stroke, diabetes, hypertension and atrial fibrillation (currently on Eliquis) who presents to the interventional radiology clinic for evaluation of percutaneous treatment of T8 compression fracture.   Patient has undergone multiple previous kyphoplasty procedures, most recently a T6 vertebroplasty by Dr. Geroge Baseman on 12/05/2014, and describes this new acute severe back pain as similar to her previous compression deformities.  Imaging demonstrates the sequela of multiple previous vertebral body augmentations with an approximately 40% height loss of the T8 vertebral body with associated fracture line and STIR signal involving the superior endplate.  The patient describes her pain as primarily located at this location and is point tender with palpation of this location.  Given above, I feel this patient is an excellent candidate for fluoroscopic guided kyphoplasty.   The risks and benefits of T8 kyphoplasty were discussed with the patient including, but not limited to  education regarding the natural healing process of compression fractures without intervention, bleeding, infection, cement migration which may cause spinal cord damage, paralysis, pulmonary embolism or even death.  Following this prolonged and detailed conversation, the patient wishes to undergo T8 kyphoplasty ASAP.  As such, pending insurance approval, this procedure will be scheduled on an outpatient basis at the next appointment appointment either at [facility] or [facility]. Note, patient is agreeable to have the procedure performed by one of my capable interventional radiology partners if this will entail the procedure be performed in a more expeditious manner.  Per the patient's family, her Eilquis has been held for previous procedures without requiring a Lovenox bridge. As such, the patient should be held for at least 48 hours prior to the kyphoplasty.   Thank you for this interesting consult.  I greatly enjoyed meeting [name] and look forward to participating in their care.  A copy of this report was sent  to the requesting provider on this date.  Electronically Signed: Sandi Mariscal 10/18/2017, 10:44 AM   I spent a total of 15 Minutes in face to face in clinical consultation, greater than 50% of which was counseling/coordinating care for symptomatic T8 compression fracture

## 2017-10-18 NOTE — Telephone Encounter (Signed)
Pt scheduled for kyphoplasty Per Dr Laurance Flatten okay to hold Eliquis x 2 days for procedure [facility] notified

## 2017-10-19 ENCOUNTER — Other Ambulatory Visit: Payer: Self-pay | Admitting: Radiology

## 2017-10-20 ENCOUNTER — Encounter (HOSPITAL_COMMUNITY): Payer: Self-pay | Admitting: Interventional Radiology

## 2017-10-20 ENCOUNTER — Ambulatory Visit (HOSPITAL_COMMUNITY)
Admission: RE | Admit: 2017-10-20 | Discharge: 2017-10-20 | Disposition: A | Discharge status: Home / Self Care | Payer: Medicare Other | Source: Ambulatory Visit | Attending: Interventional Radiology | Admitting: Interventional Radiology

## 2017-10-20 DIAGNOSIS — I482 Chronic atrial fibrillation: Secondary | ICD-10-CM | POA: Diagnosis not present

## 2017-10-20 DIAGNOSIS — M4854XA Collapsed vertebra, not elsewhere classified, thoracic region, initial encounter for fracture: Secondary | ICD-10-CM | POA: Insufficient documentation

## 2017-10-20 DIAGNOSIS — Z7901 Long term (current) use of anticoagulants: Secondary | ICD-10-CM

## 2017-10-20 DIAGNOSIS — J449 Chronic obstructive pulmonary disease, unspecified: Secondary | ICD-10-CM | POA: Diagnosis not present

## 2017-10-20 DIAGNOSIS — R4781 Slurred speech: Secondary | ICD-10-CM | POA: Diagnosis not present

## 2017-10-20 DIAGNOSIS — Z8249 Family history of ischemic heart disease and other diseases of the circulatory system: Secondary | ICD-10-CM | POA: Diagnosis not present

## 2017-10-20 DIAGNOSIS — M546 Pain in thoracic spine: Secondary | ICD-10-CM

## 2017-10-20 DIAGNOSIS — Z885 Allergy status to narcotic agent status: Secondary | ICD-10-CM

## 2017-10-20 DIAGNOSIS — R4020 Unspecified coma: Secondary | ICD-10-CM | POA: Diagnosis not present

## 2017-10-20 DIAGNOSIS — N183 Chronic kidney disease, stage 3 (moderate): Secondary | ICD-10-CM | POA: Diagnosis not present

## 2017-10-20 DIAGNOSIS — I639 Cerebral infarction, unspecified: Secondary | ICD-10-CM | POA: Diagnosis not present

## 2017-10-20 DIAGNOSIS — G9341 Metabolic encephalopathy: Secondary | ICD-10-CM | POA: Diagnosis not present

## 2017-10-20 DIAGNOSIS — Z7984 Long term (current) use of oral hypoglycemic drugs: Secondary | ICD-10-CM | POA: Diagnosis not present

## 2017-10-20 DIAGNOSIS — E1122 Type 2 diabetes mellitus with diabetic chronic kidney disease: Secondary | ICD-10-CM | POA: Diagnosis not present

## 2017-10-20 DIAGNOSIS — R29810 Facial weakness: Secondary | ICD-10-CM | POA: Diagnosis not present

## 2017-10-20 DIAGNOSIS — I1310 Hypertensive heart and chronic kidney disease without heart failure, with stage 1 through stage 4 chronic kidney disease, or unspecified chronic kidney disease: Secondary | ICD-10-CM | POA: Diagnosis not present

## 2017-10-20 DIAGNOSIS — Z833 Family history of diabetes mellitus: Secondary | ICD-10-CM | POA: Diagnosis not present

## 2017-10-20 DIAGNOSIS — E785 Hyperlipidemia, unspecified: Secondary | ICD-10-CM | POA: Diagnosis not present

## 2017-10-20 DIAGNOSIS — Z515 Encounter for palliative care: Secondary | ICD-10-CM | POA: Diagnosis not present

## 2017-10-20 DIAGNOSIS — M4850XA Collapsed vertebra, not elsewhere classified, site unspecified, initial encounter for fracture: Secondary | ICD-10-CM

## 2017-10-20 DIAGNOSIS — R400 Somnolence: Secondary | ICD-10-CM | POA: Diagnosis not present

## 2017-10-20 DIAGNOSIS — Z66 Do not resuscitate: Secondary | ICD-10-CM | POA: Diagnosis not present

## 2017-10-20 DIAGNOSIS — M810 Age-related osteoporosis without current pathological fracture: Secondary | ICD-10-CM | POA: Diagnosis not present

## 2017-10-20 HISTORY — PX: IR KYPHO THORACIC WITH BONE BIOPSY: IMG5518

## 2017-10-20 LAB — CBC
HCT: 30.5 % — ABNORMAL LOW (ref 36.0–46.0)
Hemoglobin: 9.8 g/dL — ABNORMAL LOW (ref 12.0–15.0)
MCH: 26.8 pg (ref 26.0–34.0)
MCHC: 32.1 g/dL (ref 30.0–36.0)
MCV: 83.3 fL (ref 78.0–100.0)
PLATELETS: 330 10*3/uL (ref 150–400)
RBC: 3.66 MIL/uL — AB (ref 3.87–5.11)
RDW: 14.2 % (ref 11.5–15.5)
WBC: 11 10*3/uL — ABNORMAL HIGH (ref 4.0–10.5)

## 2017-10-20 LAB — PROTIME-INR
INR: 1.2
PROTHROMBIN TIME: 15.1 s (ref 11.4–15.2)

## 2017-10-20 LAB — APTT: aPTT: 38 seconds — ABNORMAL HIGH (ref 24–36)

## 2017-10-20 LAB — GLUCOSE, CAPILLARY: Glucose-Capillary: 135 mg/dL — ABNORMAL HIGH (ref 65–99)

## 2017-10-20 MED ORDER — CLONIDINE HCL 0.2 MG PO TABS
0.2000 mg | ORAL_TABLET | Freq: Once | ORAL | Status: AC
  Administered 2017-10-20: 0.2 mg via ORAL
  Filled 2017-10-20: qty 1

## 2017-10-20 MED ORDER — SODIUM CHLORIDE 0.9 % IV SOLN
INTRAVENOUS | Status: DC
  Administered 2017-10-20: 10:00:00 via INTRAVENOUS

## 2017-10-20 MED ORDER — MIDAZOLAM HCL 2 MG/2ML IJ SOLN
INTRAMUSCULAR | Status: AC
  Filled 2017-10-20: qty 6

## 2017-10-20 MED ORDER — FENTANYL CITRATE (PF) 100 MCG/2ML IJ SOLN
INTRAMUSCULAR | Status: AC | PRN
  Administered 2017-10-20: 50 ug via INTRAVENOUS
  Administered 2017-10-20 (×2): 25 ug via INTRAVENOUS

## 2017-10-20 MED ORDER — BUPIVACAINE HCL (PF) 0.5 % IJ SOLN
INTRAMUSCULAR | Status: AC
  Filled 2017-10-20: qty 30

## 2017-10-20 MED ORDER — CEFAZOLIN SODIUM-DEXTROSE 2-4 GM/100ML-% IV SOLN
INTRAVENOUS | Status: AC
  Filled 2017-10-20: qty 100

## 2017-10-20 MED ORDER — IOPAMIDOL (ISOVUE-300) INJECTION 61%
INTRAVENOUS | Status: AC
  Administered 2017-10-20: 15 mL
  Filled 2017-10-20: qty 50

## 2017-10-20 MED ORDER — CEFAZOLIN SODIUM-DEXTROSE 2-4 GM/100ML-% IV SOLN
2.0000 g | Freq: Once | INTRAVENOUS | Status: AC
  Administered 2017-10-20: 2 g via INTRAVENOUS

## 2017-10-20 MED ORDER — FENTANYL CITRATE (PF) 100 MCG/2ML IJ SOLN
INTRAMUSCULAR | Status: AC
  Filled 2017-10-20: qty 6

## 2017-10-20 MED ORDER — LIDOCAINE HCL 1 % IJ SOLN
INTRAMUSCULAR | Status: AC
  Filled 2017-10-20: qty 20

## 2017-10-20 MED ORDER — BUPIVACAINE HCL (PF) 0.5 % IJ SOLN
INTRAMUSCULAR | Status: AC | PRN
  Administered 2017-10-20: 20 mL

## 2017-10-20 MED ORDER — MIDAZOLAM HCL 2 MG/2ML IJ SOLN
INTRAMUSCULAR | Status: AC | PRN
  Administered 2017-10-20: 0.5 mg via INTRAVENOUS
  Administered 2017-10-20: 1 mg via INTRAVENOUS
  Administered 2017-10-20: 0.5 mg via INTRAVENOUS

## 2017-10-20 NOTE — Sedation Documentation (Signed)
Patient is resting comfortably. 

## 2017-10-20 NOTE — H&P (Signed)
Supervising Physician: Arne Cleveland  Patient Status:  [facility] - In-pt  Chief Complaint: Compression T8  Subjective: Complains of severe back pain.  Family with her today.  State they had all questions and concerns addressed with Dr. Pascal Lux in IR clinic.   No fever, chills, urinary urgency or frequency.   Allergies: Ace inhibitors; Codeine; and Vytorin [ezetimibe-simvastatin]  Medications: Prior to Admission medications   Medication Sig Start Date End Date Taking? Authorizing Provider  acetaminophen (TYLENOL) 500 MG tablet Take 1,000 mg by mouth every 4 (four) hours as needed.   Yes [provider]  diltiazem (CARDIZEM CD) 240 MG 24 hr capsule TAKE (1) CAPSULE DAILY 03/29/17  Yes Chipper Herb, MD  metFORMIN (GLUCOPHAGE-XR) 500 MG 24 hr tablet TAKE 1 TABLET ONCE DAILY WITH LARGEST MEAL. 03/29/17  Yes Chipper Herb, MD  sertraline (ZOLOFT) 100 MG tablet Take 1 tablet (100 mg total) by mouth daily. 03/29/17  Yes Chipper Herb, MD  apixaban Arne Cleveland) 2.5 MG TABS tablet TAKE (1) TABLET TWICE A DAY. 03/29/17   Chipper Herb, MD     Vital Signs: BP (!) 165/78 (BP Location: Right Arm)   Pulse (!) 101   Temp 97.7 F (36.5 C) (Oral)   Ht 5' (1.524 m)   Wt 102 lb (46.3 kg)   SpO2 97%   BMI 19.92 kg/m   Physical Exam  Constitutional: She is oriented to person, place, and time. She appears well-developed.  Cardiovascular: Normal rate, regular rhythm and normal heart sounds.  Pulmonary/Chest: Effort normal and breath sounds normal. No respiratory distress.  Abdominal: Soft.  Musculoskeletal:  Back pain with movement.  Neurological: She is alert and oriented to person, place, and time.  Skin: Skin is warm and dry.  Psychiatric: She has a normal mood and affect. Her behavior is normal. Judgment and thought content normal.  Nursing note and vitals reviewed.    Imaging: Ir Radiologist Eval & Mgmt  Result Date: 10/18/2017 Please refer to notes tab for details about  interventional procedure. (Op Note)   Labs:  CBC: Recent Labs    03/29/17 1520 09/21/17 1109 10/20/17 0953  WBC 9.9 10.3 11.0*  HGB 11.3 10.4* 9.8*  HCT 33.9* 31.3* 30.5*  PLT 272 274 330    COAGS: Recent Labs    10/20/17 0953  INR 1.20  APTT 38*    BMP: Recent Labs    03/29/17 1520 09/21/17 1109  NA 141 143  K 4.9 4.2  CL 102 101  CO2 22 24  GLUCOSE 124* 128*  BUN 21 13  CALCIUM 9.9 10.1  CREATININE 1.18* 1.13*  GFRNONAA 39* 41*  GFRAA 45* 47*    LIVER FUNCTION TESTS: Recent Labs    03/29/17 1520 09/21/17 1109  BILITOT <0.2 0.3  AST 18 18  ALT 8 12  ALKPHOS 81 87  PROT 7.1 7.2  ALBUMIN 4.5 4.6    Assessment and Plan: Patient with past medical history of spinal compression fractures s/p intervention with Dr. Estanislado Pandy presents with complaint of new compression fracture at T8.  IR consulted for vertebroplasty/kyphoplasty. Case reviewed by Dr. Pascal Lux who met with patient and family in clinic 10/18/17 and approves patient for procedure.  Patient presents today in their usual state of health.  She has been NPO and has appropriately held her Eliquis.   Risks and benefits were discussed with the patient including, but not limited to education regarding the natural healing process of compression fractures without intervention, bleeding, infection,  cement migration which may cause spinal cord damage, paralysis, pulmonary embolism or even death.  This interventional procedure involves the use of X-rays and because of the nature of the planned procedure, it is possible that we will have prolonged use of X-ray fluoroscopy.  Potential radiation risks to you include (but are not limited to) the following: - A slightly elevated risk for cancer  several years later in life. This risk is typically less than 0.5% percent. This risk is low in comparison to the normal incidence of human cancer, which is 33% for women and 50% for men according to the [place]. - Radiation induced injury can include skin redness, resembling a rash, tissue breakdown / ulcers and hair loss (which can be temporary or permanent).   The likelihood of either of these occurring depends on the difficulty of the procedure and whether you are sensitive to radiation due to previous procedures, disease, or genetic conditions.   IF your procedure requires a prolonged use of radiation, you will be notified and given written instructions for further action.  It is your responsibility to monitor the irradiated area for the 2 weeks following the procedure and to notify your physician if you are concerned that you have suffered a radiation induced injury.    All of the patient's questions were answered, patient is agreeable to proceed.  Consent signed and in chart.  Electronically Signed: Docia Barrier, PA 10/20/2017, 10:57 AM   I spent a total of 15 Minutes at the the patient's bedside AND on the patient's hospital floor or unit, greater than 50% of which was counseling/coordinating care for T8 compression fracture.

## 2017-10-20 NOTE — Procedures (Signed)
  Procedure: THoracic T8 kyphoplasty   EBL:   minimal Complications:  none immediate  See full dictation in [facility].  Dillard Cannon MD Main # [phone] Pager  [phone]

## 2017-10-21 ENCOUNTER — Emergency Department (HOSPITAL_COMMUNITY): Payer: Medicare Other

## 2017-10-21 ENCOUNTER — Encounter (HOSPITAL_COMMUNITY): Payer: Self-pay | Admitting: Emergency Medicine

## 2017-10-21 ENCOUNTER — Inpatient Hospital Stay (HOSPITAL_COMMUNITY)
Admission: EM | Admit: 2017-10-21 | Discharge: 2017-10-23 | DRG: 981 | Disposition: A | Discharge status: Hospice | Payer: Medicare Other | Attending: Internal Medicine | Admitting: Internal Medicine

## 2017-10-21 ENCOUNTER — Other Ambulatory Visit: Payer: Self-pay

## 2017-10-21 DIAGNOSIS — I639 Cerebral infarction, unspecified: Secondary | ICD-10-CM

## 2017-10-21 DIAGNOSIS — Z7984 Long term (current) use of oral hypoglycemic drugs: Secondary | ICD-10-CM

## 2017-10-21 DIAGNOSIS — M810 Age-related osteoporosis without current pathological fracture: Secondary | ICD-10-CM | POA: Diagnosis present

## 2017-10-21 DIAGNOSIS — Z8249 Family history of ischemic heart disease and other diseases of the circulatory system: Secondary | ICD-10-CM | POA: Diagnosis not present

## 2017-10-21 DIAGNOSIS — R4789 Other speech disturbances: Secondary | ICD-10-CM

## 2017-10-21 DIAGNOSIS — Z833 Family history of diabetes mellitus: Secondary | ICD-10-CM | POA: Diagnosis not present

## 2017-10-21 DIAGNOSIS — I482 Chronic atrial fibrillation: Secondary | ICD-10-CM | POA: Diagnosis present

## 2017-10-21 DIAGNOSIS — R400 Somnolence: Secondary | ICD-10-CM

## 2017-10-21 DIAGNOSIS — N183 Chronic kidney disease, stage 3 unspecified: Secondary | ICD-10-CM | POA: Diagnosis present

## 2017-10-21 DIAGNOSIS — S22060S Wedge compression fracture of T7-T8 vertebra, sequela: Secondary | ICD-10-CM

## 2017-10-21 DIAGNOSIS — E785 Hyperlipidemia, unspecified: Secondary | ICD-10-CM | POA: Diagnosis present

## 2017-10-21 DIAGNOSIS — R29810 Facial weakness: Secondary | ICD-10-CM | POA: Diagnosis present

## 2017-10-21 DIAGNOSIS — E119 Type 2 diabetes mellitus without complications: Secondary | ICD-10-CM

## 2017-10-21 DIAGNOSIS — I6789 Other cerebrovascular disease: Secondary | ICD-10-CM | POA: Diagnosis not present

## 2017-10-21 DIAGNOSIS — E1122 Type 2 diabetes mellitus with diabetic chronic kidney disease: Secondary | ICD-10-CM | POA: Diagnosis present

## 2017-10-21 DIAGNOSIS — R4781 Slurred speech: Secondary | ICD-10-CM | POA: Diagnosis not present

## 2017-10-21 DIAGNOSIS — J449 Chronic obstructive pulmonary disease, unspecified: Secondary | ICD-10-CM | POA: Diagnosis not present

## 2017-10-21 DIAGNOSIS — Z7189 Other specified counseling: Secondary | ICD-10-CM

## 2017-10-21 DIAGNOSIS — Z515 Encounter for palliative care: Secondary | ICD-10-CM | POA: Diagnosis not present

## 2017-10-21 DIAGNOSIS — Z7901 Long term (current) use of anticoagulants: Secondary | ICD-10-CM | POA: Diagnosis not present

## 2017-10-21 DIAGNOSIS — I1310 Hypertensive heart and chronic kidney disease without heart failure, with stage 1 through stage 4 chronic kidney disease, or unspecified chronic kidney disease: Secondary | ICD-10-CM | POA: Diagnosis present

## 2017-10-21 DIAGNOSIS — R279 Unspecified lack of coordination: Secondary | ICD-10-CM | POA: Diagnosis not present

## 2017-10-21 DIAGNOSIS — Z66 Do not resuscitate: Secondary | ICD-10-CM | POA: Diagnosis present

## 2017-10-21 DIAGNOSIS — G9341 Metabolic encephalopathy: Secondary | ICD-10-CM | POA: Diagnosis not present

## 2017-10-21 DIAGNOSIS — S22060A Wedge compression fracture of T7-T8 vertebra, initial encounter for closed fracture: Secondary | ICD-10-CM | POA: Diagnosis present

## 2017-10-21 DIAGNOSIS — R4020 Unspecified coma: Secondary | ICD-10-CM | POA: Diagnosis not present

## 2017-10-21 DIAGNOSIS — I10 Essential (primary) hypertension: Secondary | ICD-10-CM | POA: Diagnosis present

## 2017-10-21 DIAGNOSIS — I4891 Unspecified atrial fibrillation: Secondary | ICD-10-CM | POA: Diagnosis present

## 2017-10-21 DIAGNOSIS — Z7401 Bed confinement status: Secondary | ICD-10-CM | POA: Diagnosis not present

## 2017-10-21 DIAGNOSIS — G9340 Encephalopathy, unspecified: Secondary | ICD-10-CM | POA: Diagnosis present

## 2017-10-21 LAB — BLOOD GAS, ARTERIAL
Acid-base deficit: 2.5 mmol/L — ABNORMAL HIGH (ref 0.0–2.0)
Bicarbonate: 22.6 mmol/L (ref 20.0–28.0)
DRAWN BY: 331001
FIO2: 21
O2 Saturation: 96.6 %
PH ART: 7.442 (ref 7.350–7.450)
Patient temperature: 37.4
pCO2 arterial: 31.3 mmHg — ABNORMAL LOW (ref 32.0–48.0)
pO2, Arterial: 88.9 mmHg (ref 83.0–108.0)

## 2017-10-21 LAB — CBC WITH DIFFERENTIAL/PLATELET
BASOS ABS: 0 10*3/uL (ref 0.0–0.1)
Basophils Relative: 0 %
Eosinophils Absolute: 0 10*3/uL (ref 0.0–0.7)
Eosinophils Relative: 0 %
HEMATOCRIT: 31.1 % — AB (ref 36.0–46.0)
Hemoglobin: 9.9 g/dL — ABNORMAL LOW (ref 12.0–15.0)
LYMPHS ABS: 1 10*3/uL (ref 0.7–4.0)
Lymphocytes Relative: 9 %
MCH: 26.5 pg (ref 26.0–34.0)
MCHC: 31.8 g/dL (ref 30.0–36.0)
MCV: 83.4 fL (ref 78.0–100.0)
MONO ABS: 0.4 10*3/uL (ref 0.1–1.0)
MONOS PCT: 4 %
NEUTROS ABS: 9.8 10*3/uL — AB (ref 1.7–7.7)
Neutrophils Relative %: 87 %
Platelets: 387 10*3/uL (ref 150–400)
RBC: 3.73 MIL/uL — ABNORMAL LOW (ref 3.87–5.11)
RDW: 14.2 % (ref 11.5–15.5)
WBC: 11.3 10*3/uL — ABNORMAL HIGH (ref 4.0–10.5)

## 2017-10-21 LAB — URINALYSIS, ROUTINE W REFLEX MICROSCOPIC
Bacteria, UA: NONE SEEN
Bilirubin Urine: NEGATIVE
Glucose, UA: NEGATIVE mg/dL
Hgb urine dipstick: NEGATIVE
KETONES UR: 5 mg/dL — AB
Leukocytes, UA: NEGATIVE
Nitrite: NEGATIVE
PH: 5 (ref 5.0–8.0)
Protein, ur: 30 mg/dL — AB
Specific Gravity, Urine: 1.025 (ref 1.005–1.030)

## 2017-10-21 LAB — TROPONIN I: Troponin I: 0.03 ng/mL (ref <0.03)

## 2017-10-21 LAB — PROTIME-INR
INR: 1.13
Prothrombin Time: 14.4 seconds (ref 11.4–15.2)

## 2017-10-21 LAB — COMPREHENSIVE METABOLIC PANEL
ALBUMIN: 3.1 g/dL — AB (ref 3.5–5.0)
ALT: 10 U/L — AB (ref 14–54)
AST: 17 U/L (ref 15–41)
Alkaline Phosphatase: 115 U/L (ref 38–126)
Anion gap: 13 (ref 5–15)
BUN: 25 mg/dL — AB (ref 6–20)
CHLORIDE: 106 mmol/L (ref 101–111)
CO2: 21 mmol/L — AB (ref 22–32)
CREATININE: 0.91 mg/dL (ref 0.44–1.00)
Calcium: 9.5 mg/dL (ref 8.9–10.3)
GFR calc Af Amer: 59 mL/min — ABNORMAL LOW (ref >60)
GFR, EST NON AFRICAN AMERICAN: 51 mL/min — AB (ref >60)
GLUCOSE: 156 mg/dL — AB (ref 65–99)
POTASSIUM: 3.7 mmol/L (ref 3.5–5.1)
Sodium: 140 mmol/L (ref 135–145)
Total Bilirubin: 0.9 mg/dL (ref 0.3–1.2)
Total Protein: 7.2 g/dL (ref 6.5–8.1)

## 2017-10-21 LAB — LACTIC ACID, PLASMA
LACTIC ACID, VENOUS: 0.8 mmol/L (ref 0.5–1.9)
Lactic Acid, Venous: 0.8 mmol/L (ref 0.5–1.9)

## 2017-10-21 LAB — CBG MONITORING, ED: Glucose-Capillary: 144 mg/dL — ABNORMAL HIGH (ref 65–99)

## 2017-10-21 MED ORDER — SODIUM CHLORIDE 0.9 % IV BOLUS (SEPSIS)
250.0000 mL | Freq: Once | INTRAVENOUS | Status: AC
  Administered 2017-10-21: 250 mL via INTRAVENOUS

## 2017-10-21 MED ORDER — SODIUM CHLORIDE 0.9 % IV SOLN
INTRAVENOUS | Status: DC
  Administered 2017-10-21: 100 mL/h via INTRAVENOUS

## 2017-10-21 MED ORDER — ACETAMINOPHEN 325 MG PO TABS: 650.0000 mg | ORAL_TABLET | Freq: Four times a day (QID) | ORAL | Status: DC | PRN

## 2017-10-21 MED ORDER — ONDANSETRON HCL 4 MG PO TABS: 4.0000 mg | ORAL_TABLET | Freq: Four times a day (QID) | ORAL | Status: DC | PRN

## 2017-10-21 MED ORDER — SODIUM CHLORIDE 0.9 % IV SOLN
INTRAVENOUS | Status: DC
  Administered 2017-10-21 – 2017-10-22 (×2): via INTRAVENOUS
  Administered 2017-10-23: 1000 mL via INTRAVENOUS

## 2017-10-21 MED ORDER — ENOXAPARIN SODIUM 30 MG/0.3ML ~~LOC~~ SOLN
30.0000 mg | SUBCUTANEOUS | Status: DC
  Administered 2017-10-23: 30 mg via SUBCUTANEOUS
  Filled 2017-10-21: qty 0.3

## 2017-10-21 MED ORDER — ACETAMINOPHEN 650 MG RE SUPP: 650.0000 mg | Freq: Four times a day (QID) | RECTAL | Status: DC | PRN

## 2017-10-21 MED ORDER — NALOXONE HCL 0.4 MG/ML IJ SOLN
0.4000 mg | Freq: Once | INTRAMUSCULAR | Status: AC
  Administered 2017-10-21: 0.4 mg via INTRAVENOUS
  Filled 2017-10-21: qty 1

## 2017-10-21 MED ORDER — SENNOSIDES-DOCUSATE SODIUM 8.6-50 MG PO TABS
1.0000 | ORAL_TABLET | Freq: Every evening | ORAL | Status: DC | PRN
  Filled 2017-10-21: qty 1

## 2017-10-21 MED ORDER — ONDANSETRON HCL 4 MG/2ML IJ SOLN: 4.0000 mg | Freq: Four times a day (QID) | INTRAMUSCULAR | Status: DC | PRN

## 2017-10-21 NOTE — ED Notes (Signed)
Pt had very little reaction to Narcan, pt continues to sleep with little reaction to verbal stimulation, speech continues to be garbled.

## 2017-10-21 NOTE — H&P (Signed)
History and Physical    KARNA ABED MWN:[id number] DOB: 1917-11-18 DOA: 10/21/2017  Referring MD/NP/PA: Francine Graven, EDP  PCP: Chipper Herb, MD  Patient coming from: Home   Chief Complaint: confusion, stuttering speech   HPI: [name] is a 84 y.o. female with history of atrial fibrillation, diabetes, hypertension who recently suffered a T8 compression fracture and was having significant pain and had a kyphoplasty done day prior to admission on 10/20/2017.  Her son at bedside gives all the history as patient is currently confused.  Son states that early this morning he noticed she had woken up and was going to the bathroom ever since then she has been confused with stuttering speech and had difficulty ambulating.  He brought her to the hospital for evaluation.  She is a bit hypertensive with a blood pressure around 170/86, otherwise vital signs are stable.  ABG has a pH of 7.44 with a PCO2 of 31 and a PO2 of 89.  Chemistries are within normal limits with the exception of a glucose of 156, troponin is slightly elevated at 0.03, WBCs are 11.3, her hemoglobin is 9.9.  Urinalysis and chest x-ray are negative for infection, CT scan of the head is negative for acute changes, EKG shows chronic A. fib but no acute ischemic changes.  Of note patient's Eliquis has been held 3 days prior to kyphoplasty.  Admission is requested for potential stroke workup.  Past Medical/Surgical History: Past Medical History:  Diagnosis Date  . Atrial fibrillation ([place])   . Basal cell cancer    right shoulder  . Diabetes mellitus   . Fatigue   . Hypertension   . Kyphosis     Past Surgical History:  Procedure Laterality Date  . BONE BIOPSY     x 5  . EYE SURGERY Right   . HIP FRACTURE SURGERY Bilateral   . IR KYPHO THORACIC WITH BONE BIOPSY  10/20/2017  . IR RADIOLOGIST EVAL & MGMT  10/18/2017    Social History:  reports that  has never smoked. she has never used smokeless tobacco. She reports that  she does not drink alcohol or use drugs.  Allergies: Allergies  Allergen Reactions  . Ace Inhibitors   . Codeine Nausea And Vomiting  . Vytorin [Ezetimibe-Simvastatin] Other (See Comments)    Pain in legs    Family History:  Family History  Problem Relation Age of Onset  . Diabetes Mother   . Heart disease Mother     Prior to Admission medications   Medication Sig Start Date End Date Taking? Authorizing Provider  acetaminophen (TYLENOL) 500 MG tablet Take 1,000 mg by mouth every 4 (four) hours as needed.   Yes [provider]  apixaban (ELIQUIS) 2.5 MG TABS tablet TAKE (1) TABLET TWICE A DAY. 03/29/17  Yes Chipper Herb, MD  diltiazem [facility] CD) 240 MG 24 hr capsule TAKE (1) CAPSULE DAILY 03/29/17  Yes Chipper Herb, MD  metFORMIN (GLUCOPHAGE-XR) 500 MG 24 hr tablet TAKE 1 TABLET ONCE DAILY WITH LARGEST MEAL. 03/29/17  Yes Chipper Herb, MD  sertraline (ZOLOFT) 100 MG tablet Take 1 tablet (100 mg total) by mouth daily. 03/29/17  Yes Chipper Herb, MD    Review of Systems:  Unable to obtain as she is currently nonverbal   Physical Exam: Vitals:   10/21/17 1300 10/21/17 1330 10/21/17 1400 10/21/17 1430  BP: (!) 106/91 (!) 151/109 (!) 166/88 (!) 170/86  Pulse: (!) 107 97 (!) 105 100  Resp: (!) 25 18 (!) 31 19  Temp:      TempSrc:      SpO2: 96% 95% 94% 95%  Weight:      Height:         Constitutional: Lying in ED stretcher, eyes closed, nonverbal Eyes: PERRL, lids and conjunctivae normal ENMT: Mucous membranes are dry. Posterior pharynx clear of any exudate or lesions.Normal dentition.  Neck: normal, supple, no masses, no thyromegaly Respiratory: clear to auscultation bilaterally, no wheezing, no crackles. Normal respiratory effort. No accessory muscle use.  Cardiovascular: Irregular rhythm, not tachycardic or bradycardic, slight systolic ejection murmur best heard at the right upper sternal border Abdomen: no tenderness, no masses palpated. No  hepatosplenomegaly. Bowel sounds positive.  Musculoskeletal: no clubbing / cyanosis. No joint deformity upper and lower extremities. Good ROM, no contractures. Normal muscle tone.  Skin: no rashes, lesions, ulcers. No induration Neurologic: Unable to assess given current mental state Psychiatric: Unable to assess given current mental state   Labs on Admission: I have personally reviewed the following labs and imaging studies  CBC: Recent Labs  Lab 10/20/17 0953 10/21/17 1123  WBC 11.0* 11.3*  NEUTROABS  --  9.8*  HGB 9.8* 9.9*  HCT 30.5* 31.1*  MCV 83.3 83.4  PLT 330 161   Basic Metabolic Panel: Recent Labs  Lab 10/21/17 1123  NA 140  K 3.7  CL 106  CO2 21*  GLUCOSE 156*  BUN 25*  CREATININE 0.91  CALCIUM 9.5   GFR: Estimated Creatinine Clearance: 25.4 mL/min (by C-G formula based on SCr of 0.91 mg/dL). Liver Function Tests: Recent Labs  Lab 10/21/17 1123  AST 17  ALT 10*  ALKPHOS 115  BILITOT 0.9  PROT 7.2  ALBUMIN 3.1*   No results for input(s): LIPASE, AMYLASE in the last 168 hours. No results for input(s): AMMONIA in the last 168 hours. Coagulation Profile: Recent Labs  Lab 10/20/17 0953 10/21/17 1123  INR 1.20 1.13   Cardiac Enzymes: Recent Labs  Lab 10/21/17 1123  TROPONINI 0.03*   BNP (last 3 results) No results for input(s): PROBNP in the last 8760 hours. HbA1C: No results for input(s): HGBA1C in the last 72 hours. CBG: Recent Labs  Lab 10/20/17 0953 10/21/17 1027  GLUCAP 135* 144*   Lipid Profile: No results for input(s): CHOL, HDL, LDLCALC, TRIG, CHOLHDL, LDLDIRECT in the last 72 hours. Thyroid Function Tests: No results for input(s): TSH, T4TOTAL, FREET4, T3FREE, THYROIDAB in the last 72 hours. Anemia Panel: No results for input(s): VITAMINB12, FOLATE, FERRITIN, TIBC, IRON, RETICCTPCT in the last 72 hours. Urine analysis:    Component Value Date/Time   COLORURINE YELLOW 10/21/2017 1151   APPEARANCEUR CLEAR 10/21/2017 1151     APPEARANCEUR Hazy (A) 09/21/2017 1053   LABSPEC 1.025 10/21/2017 1151   PHURINE 5.0 10/21/2017 1151   GLUCOSEU NEGATIVE 10/21/2017 1151   HGBUR NEGATIVE 10/21/2017 1151   BILIRUBINUR NEGATIVE 10/21/2017 1151   BILIRUBINUR Negative 09/21/2017 1053   KETONESUR 5 (A) 10/21/2017 1151   PROTEINUR 30 (A) 10/21/2017 1151   UROBILINOGEN negative 10/02/2014 1319   UROBILINOGEN 0.2 01/14/2013 1130   NITRITE NEGATIVE 10/21/2017 1151   LEUKOCYTESUR NEGATIVE 10/21/2017 1151   LEUKOCYTESUR Negative 09/21/2017 1053   Sepsis Labs: @LABRCNTIP (procalcitonin:4,lacticidven:4) )No results found for this or any previous visit (from the past 240 hour(s)).   Radiological Exams on Admission: Dg Chest 1 View  Result Date: 10/21/2017 CLINICAL DATA:  Altered mental status EXAM: CHEST 1 VIEW COMPARISON:  01/14/2013 FINDINGS: There is  hyperinflation of the lungs compatible with COPD. Cardiomegaly. No confluent airspace opacities or effusions. Degenerative changes in the shoulders. No acute bony abnormality. IMPRESSION: COPD.  Cardiomegaly.  No active cardiopulmonary disease. Electronically Signed   By: Rolm Baptise M.D.   On: 10/21/2017 11:23   Ct Head Wo Contrast  Result Date: 10/21/2017 CLINICAL DATA:  Altered level of consciousness. EXAM: CT HEAD WITHOUT CONTRAST TECHNIQUE: Contiguous axial images were obtained from the base of the skull through the vertex without intravenous contrast. COMPARISON:  01/14/2013 FINDINGS: Brain: Severe atrophy and chronic small vessel disease. No acute intracranial abnormality. Specifically, no hemorrhage, hydrocephalus, mass lesion, acute infarction, or significant intracranial injury. Vascular: No hyperdense vessel or unexpected calcification. Skull: No acute calvarial abnormality. Sinuses/Orbits: Opacified right maxillary sinus and right frontal sinus. Mucosal disease throughout the remainder of the paranasal sinuses. Mastoid air cells are clear. Other: None IMPRESSION: Severe  atrophy, chronic microvascular disease. No acute intracranial abnormality. Severe chronic pansinusitis. Electronically Signed   By: Rolm Baptise M.D.   On: 10/21/2017 11:22   Ir Kypho Thoracic With Bone Biopsy  Result Date: 10/20/2017 CLINICAL DATA:  Painful subacute thoracic T8 compression fracture. EXAM: KYPHOPLASTY AT THORACIC T8 TECHNIQUE: The procedure, risks (including but not limited to bleeding, infection, organ damage), benefits, and alternatives were explained to the patient. Questions regarding the procedure were encouraged and answered. The patient understands and consents to the procedure. The patient was placed prone on the fluoroscopic table. The skin overlying the midthoracic region was then prepped and draped in the usual sterile fashion. Maximal barrier sterile technique was utilized including caps, mask, sterile gowns, sterile gloves, sterile drape, hand hygiene and skin antiseptic. Intravenous Fentanyl and Versed were administered as conscious sedation during continuous cardiorespiratory monitoring by the radiology RN, with a total moderate sedation time of28 minutes. As antibiotic prophylaxis, cefazolin 2 g was ordered pre-procedure and administered intravenously within !one hour! of incision. The right pedicle at thoracic T8 was then infiltrated with 1% lidocaine followed by the advancement of a Kyphon trocar needle through the right pedicle into the posterior one-third. The trocar was removed and the osteo drill was advanced to the anterior third of the vertebral body. The osteo drill was retracted. Through the working cannula, a Kyphon inflatable bone tamp 15 x 3 was advanced and positioned with the distal marker 5 mm from the anterior aspect of the cortex. Crossing of the midline was not seen on the AP projection. At this time, the balloon was expanded using contrast via a Kyphon inflation syringe device via micro tubing. In similar fashion, the left pedicle was infiltrated with 1%  lidocaine followed by the advancement of a second Kyphon trocar needle through the left pedicle into the posterior third of the vertebral body. The osteo drill was coaxially advanced to the anterior right third. The osteo drill was exchanged for a Kyphon inflatable bone tamp 15x 3, advanced to the 5 mm of the anterior aspect of the cortex. The balloon was then expanded using contrast as above. Inflations were continued until there was near apposition across the midline and with the superior and the inferior end plates. At this time, methylmethacrylate mixture was reconstituted in the Kyphon bone mixing device system. This was then loaded into the delivery mechanism, attached to Kyphon bone fillers. The balloons were deflated and removed followed by the instillation of methylmethacrylate mixture with excellent filling in the AP and lateral projections. No extravasation was noted in the disk spaces or posteriorly into the spinal  canal. No epidural venous contamination was seen. The patient tolerated the procedure well. There were no acute complications. The working cannulae and the bone filler were then retrieved and removed. COMPLICATIONS: COMPLICATIONS None immediate. IMPRESSION: 1. Status post vertebral body augmentation using balloon kyphoplasty at thoracic T8 as described without event. 2. Per CMS PQRS reporting requirements (PQRS Measure 24): Given the patient's age of greater than 51 and the fracture site (hip, distal radius, or spine), the patient should be tested for osteoporosis using DXA, and the appropriate treatment considered based on the DXA results. Electronically Signed   By: Lucrezia Europe M.D.   On: 10/20/2017 14:39    EKG: Independently reviewed.  Atrial fibrillation, no acute ischemic changes  Assessment/Plan Principal Problem:   Acute metabolic encephalopathy Active Problems:   Diabetes mellitus (HCC)   Atrial fibrillation (HCC)   Hypertension   Hyperlipidemia   Closed wedge compression  fracture of T8 vertebra (HCC)   Hypertensive heart and kidney disease without heart failure and with chronic kidney disease stage III (HCC)   CVA (cerebral vascular accident) ([place])   Acute encephalopathy    Acute metabolic encephalopathy -Infectious sources have been ruled out in the ED, no significant electrolyte abnormalities to account for her encephalopathy. -She did receive Narcan without effect, her CBG was about 150. -At this point we are assuming she has had a CVA, she does have what appears to be a left facial droop on exam.  It is important to note that she has been off her Eliquis for 3-4 days due to her recent kyphoplasty.  It is possible she might have embolized. -After discussion with patient's son at bedside, we have decided to not pursue aggressive testing given her advanced age.  He does not want to do MRIs, echoes, Dopplers. -Plan for now to observe, have speech therapy assess her to see if she is safe for diet.  Have discussed with son that I believe at this point she would be appropriate for hospice care, will ask palliative care to consult when available.  Atrial fibrillation -Rate controlled, currently off anticoagulation given recent kyphoplasty and n.p.o. state.  Hypertension -Systolic blood pressure is slightly elevated, will allow permissive hypertension given suspicion for acute CVA.   DVT prophylaxis: Lovenox Code Status: DNR Family Communication: Discussed with son and sister at bedside Disposition Plan: Admit for observation, IV fluids and palliative evaluation Consults called: Palliative care Admission status: Inpatient   Time Spent: 85 minutes  Estela Isaac Bliss MD [facility] Pager [phone]  If 7PM-7AM, please contact night-coverage [email or web address] Password [email or web address]  10/21/2017, 2:54 PM

## 2017-10-21 NOTE — ED Provider Notes (Signed)
[facility] EMERGENCY DEPARTMENT Provider Note   CSN: [id number] Arrival date & time: 10/21/17  1005     History   Chief Complaint Chief Complaint  Patient presents with  . Altered Mental Status    HPI [name] is a 84 y.o. female.  The history is provided by a relative and the EMS personnel. The history is limited by the condition of the patient (AMS, garbled speech).  Altered Mental Status    Pt was seen at 1025. Per EMS, pt's family report:  Pt s/p T8 kyphoplasty yesterday afternoon. Pt's family states pt was mildly lethargic, but was able to speak with family in clear voice. Pt went to bed at 1900 last night (LKW). Pt woke up early this morning with garbled speech, difficulty ambulating, and increasing lethargy.  No reported falls, no vomiting/diarrhea, no SOB/cough, no fevers.   Past Medical History:  Diagnosis Date  . Atrial fibrillation ([place])   . Basal cell cancer    right shoulder  . Diabetes mellitus   . Fatigue   . Hypertension   . Kyphosis     Patient Active Problem List   Diagnosis Date Noted  . Hypertensive heart and kidney disease without heart failure and with chronic kidney disease stage III ([place]) 09/22/2017  . Compression fracture   . Proximal humerus fracture 05/29/2014  . Chronic insomnia 04/03/2014  . Vitamin D deficiency 04/03/2014  . Diabetes mellitus ([place]) 08/19/2013  . Atrial fibrillation ([place]) 08/19/2013  . High risk medication use 08/19/2013  . Hypertension 08/19/2013  . Hyperlipidemia 08/19/2013  . CLOSED FRACTURE UNSPECIFIED PART NECK FEMUR, history of 09/14/2009    Past Surgical History:  Procedure Laterality Date  . BONE BIOPSY     x 5  . EYE SURGERY Right   . HIP FRACTURE SURGERY Bilateral   . IR KYPHO THORACIC WITH BONE BIOPSY  10/20/2017  . IR RADIOLOGIST EVAL & MGMT  10/18/2017    OB History    No data available       Home Medications    Prior to Admission medications   Medication Sig Start Date End Date Taking?  Authorizing Provider  acetaminophen (TYLENOL) 500 MG tablet Take 1,000 mg by mouth every 4 (four) hours as needed.   Yes [provider]  apixaban (ELIQUIS) 2.5 MG TABS tablet TAKE (1) TABLET TWICE A DAY. 03/29/17  Yes Chipper Herb, MD  diltiazem [facility] CD) 240 MG 24 hr capsule TAKE (1) CAPSULE DAILY 03/29/17  Yes Chipper Herb, MD  metFORMIN (GLUCOPHAGE-XR) 500 MG 24 hr tablet TAKE 1 TABLET ONCE DAILY WITH LARGEST MEAL. 03/29/17  Yes Chipper Herb, MD  sertraline (ZOLOFT) 100 MG tablet Take 1 tablet (100 mg total) by mouth daily. 03/29/17  Yes Chipper Herb, MD    Family History Family History  Problem Relation Age of Onset  . Diabetes Mother   . Heart disease Mother     Social History Social History   Tobacco Use  . Smoking status: Never Smoker  . Smokeless tobacco: Never Used  Substance Use Topics  . Alcohol use: No  . Drug use: No     Allergies   Ace inhibitors; Codeine; and Vytorin [ezetimibe-simvastatin]   Review of Systems Review of Systems  Unable to perform ROS: Mental status change     Physical Exam Updated Vital Signs BP (!) 106/91   Pulse (!) 107   Temp 99.4 F (37.4 C) (Rectal)   Resp (!) 25  Ht 5' (1.524 m)   Wt 46.3 kg (102 lb)   SpO2 96%   BMI 19.92 kg/m   Physical Exam 1030: Physical examination:  Nursing notes reviewed; Vital signs and O2 SAT reviewed;  Constitutional: Well developed, Well nourished, In no acute distress; Head:  Normocephalic, atraumatic; Eyes: EOMI, PERRL, No scleral icterus; ENMT: Mouth and pharynx normal, Mucous membranes dry; Neck: Supple, Full range of motion, No lymphadenopathy; Cardiovascular: Irregular rate and rhythm, No gallop; Respiratory: Breath sounds clear & equal bilaterally, No wheezes. Normal respiratory effort/excursion; Chest: Nontender, Movement normal; Abdomen: Soft, Nontender, Nondistended, Normal bowel sounds; Genitourinary: No CVA tenderness; Extremities: Pulses normal, No tenderness, No  edema, No calf edema or asymmetry.; Neuro: Lethargic, but opens eyes to name, will grab hand of examiner, and talk with garbled speech. No clear facial droop. Moves all extremities on stretcher spontaneously..; Skin: Color normal, Warm, Dry.    ED Treatments / Results  Labs (all labs ordered are listed, but only abnormal results are displayed)   EKG  EKG Interpretation  Date/Time:  Saturday October 21 2017 10:20:58 EST Ventricular Rate:  81 PR Interval:    QRS Duration: 97 QT Interval:  380 QTC Calculation: 442 R Axis:   83 Text Interpretation:  Atrial fibrillation Borderline right axis deviation Anteroseptal infarct, old Borderline repol abnormality, diffuse leads Baseline wander When compared with ECG of 01/14/2013 Nonspecific ST and T wave abnormality is now Present Confirmed by Francine Graven [phone]) on 10/21/2017 10:56:30 AM       Radiology   Procedures Procedures (including critical care time)  Medications Ordered in ED Medications  naloxone (NARCAN) injection 0.4 mg (0.4 mg Intravenous Given 10/21/17 1055)     Initial Impression / Assessment and Plan / ED Course  I have reviewed the triage vital signs and the nursing notes.  Pertinent labs & imaging results that were available during my care of the patient were reviewed by me and considered in my medical decision making (see chart for details).  MDM Reviewed: previous chart, nursing note and vitals Reviewed previous: labs and ECG Interpretation: labs, ECG, x-ray and CT scan   Results for orders placed or performed during the hospital encounter of 10/21/17  Comprehensive metabolic panel  Result Value Ref Range   Sodium 140 135 - 145 mmol/L   Potassium 3.7 3.5 - 5.1 mmol/L   Chloride 106 101 - 111 mmol/L   CO2 21 (L) 22 - 32 mmol/L   Glucose, Bld 156 (H) 65 - 99 mg/dL   BUN 25 (H) 6 - 20 mg/dL   Creatinine, Ser 0.91 0.44 - 1.00 mg/dL   Calcium 9.5 8.9 - 10.3 mg/dL   Total Protein 7.2 6.5 - 8.1 g/dL   Albumin  3.1 (L) 3.5 - 5.0 g/dL   AST 17 15 - 41 U/L   ALT 10 (L) 14 - 54 U/L   Alkaline Phosphatase 115 38 - 126 U/L   Total Bilirubin 0.9 0.3 - 1.2 mg/dL   GFR calc non Af Amer 51 (L) >60 mL/min   GFR calc Af Amer 59 (L) >60 mL/min   Anion gap 13 5 - 15  CBC with Differential  Result Value Ref Range   WBC 11.3 (H) 4.0 - 10.5 K/uL   RBC 3.73 (L) 3.87 - 5.11 MIL/uL   Hemoglobin 9.9 (L) 12.0 - 15.0 g/dL   HCT 31.1 (L) 36.0 - 46.0 %   MCV 83.4 78.0 - 100.0 fL   MCH 26.5 26.0 - 34.0 pg  MCHC 31.8 30.0 - 36.0 g/dL   RDW 14.2 11.5 - 15.5 %   Platelets 387 150 - 400 K/uL   Neutrophils Relative % 87 %   Neutro Abs 9.8 (H) 1.7 - 7.7 K/uL   Lymphocytes Relative 9 %   Lymphs Abs 1.0 0.7 - 4.0 K/uL   Monocytes Relative 4 %   Monocytes Absolute 0.4 0.1 - 1.0 K/uL   Eosinophils Relative 0 %   Eosinophils Absolute 0.0 0.0 - 0.7 K/uL   Basophils Relative 0 %   Basophils Absolute 0.0 0.0 - 0.1 K/uL  Troponin I  Result Value Ref Range   Troponin I 0.03 (HH) <0.03 ng/mL  Lactic acid, plasma  Result Value Ref Range   Lactic Acid, Venous 0.8 0.5 - 1.9 mmol/L  Lactic acid, plasma  Result Value Ref Range   Lactic Acid, Venous 0.8 0.5 - 1.9 mmol/L  Protime-INR  Result Value Ref Range   Prothrombin Time 14.4 11.4 - 15.2 seconds   INR 1.13   Urinalysis, Routine w reflex microscopic  Result Value Ref Range   Color, Urine YELLOW YELLOW   APPearance CLEAR CLEAR   Specific Gravity, Urine 1.025 1.005 - 1.030   pH 5.0 5.0 - 8.0   Glucose, UA NEGATIVE NEGATIVE mg/dL   Hgb urine dipstick NEGATIVE NEGATIVE   Bilirubin Urine NEGATIVE NEGATIVE   Ketones, ur 5 (A) NEGATIVE mg/dL   Protein, ur 30 (A) NEGATIVE mg/dL   Nitrite NEGATIVE NEGATIVE   Leukocytes, UA NEGATIVE NEGATIVE   RBC / HPF 0-5 0 - 5 RBC/hpf   WBC, UA 0-5 0 - 5 WBC/hpf   Bacteria, UA NONE SEEN NONE SEEN   Squamous Epithelial / LPF 0-5 (A) NONE SEEN   Mucus PRESENT    Hyaline Casts, UA PRESENT   Blood gas, arterial  Result Value Ref  Range   FIO2 21.00    Delivery systems ROOM AIR    pH, Arterial 7.442 7.350 - 7.450   pCO2 arterial 31.3 (L) 32.0 - 48.0 mmHg   pO2, Arterial 88.9 83.0 - 108.0 mmHg   Bicarbonate 22.6 20.0 - 28.0 mmol/L   Acid-base deficit 2.5 (H) 0.0 - 2.0 mmol/L   O2 Saturation 96.6 %   Patient temperature 37.4    Collection site LEFT RADIAL    Drawn by [id number]    Sample type ARTERIAL DRAW    Allens test (pass/fail) PASS PASS  CBG monitoring, ED  Result Value Ref Range   Glucose-Capillary 144 (H) 65 - 99 mg/dL   Dg Chest 1 View Result Date: 10/21/2017 CLINICAL DATA:  Altered mental status EXAM: CHEST 1 VIEW COMPARISON:  01/14/2013 FINDINGS: There is hyperinflation of the lungs compatible with COPD. Cardiomegaly. No confluent airspace opacities or effusions. Degenerative changes in the shoulders. No acute bony abnormality. IMPRESSION: COPD.  Cardiomegaly.  No active cardiopulmonary disease. Electronically Signed   By: Rolm Baptise M.D.   On: 10/21/2017 11:23   Ct Head Wo Contrast Result Date: 10/21/2017 CLINICAL DATA:  Altered level of consciousness. EXAM: CT HEAD WITHOUT CONTRAST TECHNIQUE: Contiguous axial images were obtained from the base of the skull through the vertex without intravenous contrast. COMPARISON:  01/14/2013 FINDINGS: Brain: Severe atrophy and chronic small vessel disease. No acute intracranial abnormality. Specifically, no hemorrhage, hydrocephalus, mass lesion, acute infarction, or significant intracranial injury. Vascular: No hyperdense vessel or unexpected calcification. Skull: No acute calvarial abnormality. Sinuses/Orbits: Opacified right maxillary sinus and right frontal sinus. Mucosal disease throughout the remainder of the paranasal sinuses.  Mastoid air cells are clear. Other: None IMPRESSION: Severe atrophy, chronic microvascular disease. No acute intracranial abnormality. Severe chronic pansinusitis. Electronically Signed   By: Rolm Baptise M.D.   On: 10/21/2017 11:22    1305:  No  clear source for AMS/garbled speech on workup thus far. ABG ordered and pt will need MRI brain to r/o stroke.  No real change in mental status after IV narcan. Dx and testing d/w pt's family.  Questions answered.  Verb understanding, agreeable to admit. T/C returned from [facility] Dr. Jerilee Hoh, case discussed, including:  HPI, pertinent PM/SHx, VS/PE, dx testing, ED course and treatment:  Agreeable to admit/transfer to [facility] for MRI and Neuro MD consult.   1430:  [facility] Dr. Jerilee Hoh has evaluated pt and spoke with pt's son: pt's son does not want extensive dx testing (ie: MRI, etc), so [facility] will admit pt to [facility] for supportive care.     Final Clinical Impressions(s) / ED Diagnoses   Final diagnoses:  None    ED Discharge Orders    None        Francine Graven, DO 10/22/17 1000

## 2017-10-21 NOTE — ED Triage Notes (Signed)
Per EMS patient from home with altered mental status. Patient had procedure on her spine yesterday and come home altered. Patient continues to be altered this morning. Patient is not alert enough to answer questions in triage.

## 2017-10-21 NOTE — ED Notes (Signed)
Oral suctioning performed with yaunker, pt had think clear secretions coming from mouth. Pt is right side lying, no evidence of emesis present.

## 2017-10-21 NOTE — Progress Notes (Signed)
Patient able to [facility] name and squeezed fingers on command very weakly.  Son stated that he and his wife live with patient and that she has been independent in the home with walker and able to get up and down by herself and feed self etc. Son stated that he has a friend that comes in and helps for 5 hours 5 days a week.  Stated that after kyphoplasty yesterday at [facility] he took her home and she seemed normal until 5 this morning when she got up to go to the bathroom and when she got to the bathroom she became very lethargic

## 2017-10-21 NOTE — ED Notes (Signed)
Spoke with Dr. Jerilee Hoh who wants all new orders she placed started once the pt arrives to the floor, not in the ED.

## 2017-10-22 LAB — URINE CULTURE: Culture: NO GROWTH

## 2017-10-22 NOTE — Progress Notes (Signed)
Patient has not voided this shift.  Bladder scan read 350 cc.  On call MD notified.  New orders to place foley cath received.  14 french foley cath placed using sterile technique per MD order.  Out put of 400 cc clear yellow urine immediately.  Patient tolerated well.

## 2017-10-22 NOTE — Progress Notes (Signed)
PROGRESS NOTE    [name]  RFF:[id number] DOB: Dec 22, 1917 DOA: 10/21/2017 PCP: Chipper Herb, MD     Brief Narrative:  84 year old woman admitted from home on 3/9 following confusion and left facial droop.  She had a kyphoplasty on 3/9.  We suspect she has had an acute CVA however family has declined further testing.   Assessment & Plan:   Principal Problem:   Acute metabolic encephalopathy Active Problems:   Diabetes mellitus (HCC)   Atrial fibrillation (HCC)   Hypertension   Hyperlipidemia   Closed wedge compression fracture of T8 vertebra (HCC)   Hypertensive heart and kidney disease without heart failure and with chronic kidney disease stage III (HCC)   CVA (cerebral vascular accident) ([place])   Acute encephalopathy   Acute metabolic encephalopathy -We assume she has had a CVA.  She has a history of A. fib and had been off her Eliquis for about 4 days due to a kyphoplasty that was performed on 3/8. -Family has, appropriately so, refused aggressive interventions given her age. -If she fails to improve over the next 24 hours they are on board with initiating hospice care. -Of note infectious sources have been ruled out, CT scan of the head was negative, no electrolyte abnormalities that could explain her confusion.  Atrial fibrillation -Currently rate controlled, off anticoagulation given recent kyphoplasty and n.p.o. state.  Hypertension -BP remains slightly elevated but within range for allowable permissive hypertension given presumed acute CVA.   DVT prophylaxis: Lovenox Code Status: DNR Family Communication: Daughter-in-law and grandson at bedside updated on plan of care and all questions answered Disposition Plan: Likely residential hospice  Consultants:   Palliative care pending  Procedures:   None  Antimicrobials:  Anti-infectives (From admission, onward)   None       Subjective: Lying in bed, unresponsive, does not open eyes to  voice  Objective: Vitals:   10/21/17 1630 10/21/17 2106 10/22/17 0608 10/22/17 1334  BP: (!) 187/106 (!) 160/63 (!) 139/93 (!) 145/90  Pulse: 91 (!) 109 (!) 110 95  Resp: 16 16 16 16   Temp: 98.8 F (37.1 C) 99 F (37.2 C) 97.8 F (36.6 C) 98.3 F (36.8 C)  TempSrc: Axillary Axillary Oral Oral  SpO2: 100% 99% 95% 99%  Weight:      Height:        Intake/Output Summary (Last 24 hours) at 10/22/2017 1532 Last data filed at 10/22/2017 0335 Gross per 24 hour  Intake 480 ml  Output -  Net 480 ml   Filed Weights   10/21/17 1019  Weight: 46.3 kg (102 lb)    Examination:  General exam: Unresponsive Respiratory system: Clear to auscultation. Respiratory effort normal. Cardiovascular system:RRR. No murmurs, rubs, gallops. Gastrointestinal system: Abdomen is nondistended, soft and nontender. No organomegaly or masses felt. Normal bowel sounds heard. Central nervous system: Unable to assess given current mental state Extremities: No C/C/E, +pedal pulses  Psychiatry: Unable to assess given current mental state    Data Reviewed: I have personally reviewed following labs and imaging studies  CBC: Recent Labs  Lab 10/20/17 0953 10/21/17 1123  WBC 11.0* 11.3*  NEUTROABS  --  9.8*  HGB 9.8* 9.9*  HCT 30.5* 31.1*  MCV 83.3 83.4  PLT 330 357   Basic Metabolic Panel: Recent Labs  Lab 10/21/17 1123  NA 140  K 3.7  CL 106  CO2 21*  GLUCOSE 156*  BUN 25*  CREATININE 0.91  CALCIUM 9.5   GFR: Estimated Creatinine  Clearance: 25.4 mL/min (by C-G formula based on SCr of 0.91 mg/dL). Liver Function Tests: Recent Labs  Lab 10/21/17 1123  AST 17  ALT 10*  ALKPHOS 115  BILITOT 0.9  PROT 7.2  ALBUMIN 3.1*   No results for input(s): LIPASE, AMYLASE in the last 168 hours. No results for input(s): AMMONIA in the last 168 hours. Coagulation Profile: Recent Labs  Lab 10/20/17 0953 10/21/17 1123  INR 1.20 1.13   Cardiac Enzymes: Recent Labs  Lab 10/21/17 1123   TROPONINI 0.03*   BNP (last 3 results) No results for input(s): PROBNP in the last 8760 hours. HbA1C: No results for input(s): HGBA1C in the last 72 hours. CBG: Recent Labs  Lab 10/20/17 0953 10/21/17 1027  GLUCAP 135* 144*   Lipid Profile: No results for input(s): CHOL, HDL, LDLCALC, TRIG, CHOLHDL, LDLDIRECT in the last 72 hours. Thyroid Function Tests: No results for input(s): TSH, T4TOTAL, FREET4, T3FREE, THYROIDAB in the last 72 hours. Anemia Panel: No results for input(s): VITAMINB12, FOLATE, FERRITIN, TIBC, IRON, RETICCTPCT in the last 72 hours. Urine analysis:    Component Value Date/Time   COLORURINE YELLOW 10/21/2017 1151   APPEARANCEUR CLEAR 10/21/2017 1151   APPEARANCEUR Hazy (A) 09/21/2017 1053   LABSPEC 1.025 10/21/2017 1151   PHURINE 5.0 10/21/2017 1151   GLUCOSEU NEGATIVE 10/21/2017 1151   HGBUR NEGATIVE 10/21/2017 1151   BILIRUBINUR NEGATIVE 10/21/2017 1151   BILIRUBINUR Negative 09/21/2017 1053   KETONESUR 5 (A) 10/21/2017 1151   PROTEINUR 30 (A) 10/21/2017 1151   UROBILINOGEN negative 10/02/2014 1319   UROBILINOGEN 0.2 01/14/2013 1130   NITRITE NEGATIVE 10/21/2017 1151   LEUKOCYTESUR NEGATIVE 10/21/2017 1151   LEUKOCYTESUR Negative 09/21/2017 1053   Sepsis Labs: @LABRCNTIP (procalcitonin:4,lacticidven:4)  ) Recent Results (from the past 240 hour(s))  Urine culture     Status: None   Collection Time: 10/21/17 11:51 AM  Result Value Ref Range Status   Specimen Description   Final    URINE, CATHETERIZED Performed at [facility], [address], [place], [place] [address]    Special Requests   Final    NONE Performed at [facility], [address] [place], [place] [address]    Culture   Final    NO GROWTH Performed at [facility], [place] [address], [place],  [address]    Report Status 10/22/2017 FINAL  Final         Radiology Studies: Dg Chest 1 View  Result Date: 10/21/2017 CLINICAL DATA:  Altered mental status EXAM:  CHEST 1 VIEW COMPARISON:  01/14/2013 FINDINGS: There is hyperinflation of the lungs compatible with COPD. Cardiomegaly. No confluent airspace opacities or effusions. Degenerative changes in the shoulders. No acute bony abnormality. IMPRESSION: COPD.  Cardiomegaly.  No active cardiopulmonary disease. Electronically Signed   By: Rolm Baptise M.D.   On: 10/21/2017 11:23   Ct Head Wo Contrast  Result Date: 10/21/2017 CLINICAL DATA:  Altered level of consciousness. EXAM: CT HEAD WITHOUT CONTRAST TECHNIQUE: Contiguous axial images were obtained from the base of the skull through the vertex without intravenous contrast. COMPARISON:  01/14/2013 FINDINGS: Brain: Severe atrophy and chronic small vessel disease. No acute intracranial abnormality. Specifically, no hemorrhage, hydrocephalus, mass lesion, acute infarction, or significant intracranial injury. Vascular: No hyperdense vessel or unexpected calcification. Skull: No acute calvarial abnormality. Sinuses/Orbits: Opacified right maxillary sinus and right frontal sinus. Mucosal disease throughout the remainder of the paranasal sinuses. Mastoid air cells are clear. Other: None IMPRESSION: Severe atrophy, chronic microvascular disease. No acute  intracranial abnormality. Severe chronic pansinusitis. Electronically Signed   By: Rolm Baptise M.D.   On: 10/21/2017 11:22        Scheduled Meds: . enoxaparin (LOVENOX) injection  30 mg Subcutaneous Q24H   Continuous Infusions: . sodium chloride 50 mL/hr at 10/22/17 1308     LOS: 1 day    Time spent: 25 minutes. Greater than 50% of this time was spent in direct contact with the patient coordinating care.     Lelon Frohlich, MD [facility] Pager [phone]  If 7PM-7AM, please contact night-coverage [email or web address] Password [email or web address] 10/22/2017, 3:32 PM

## 2017-10-23 ENCOUNTER — Encounter (HOSPITAL_COMMUNITY): Payer: Self-pay | Admitting: Primary Care

## 2017-10-23 DIAGNOSIS — Z7189 Other specified counseling: Secondary | ICD-10-CM

## 2017-10-23 DIAGNOSIS — G9341 Metabolic encephalopathy: Secondary | ICD-10-CM

## 2017-10-23 DIAGNOSIS — Z515 Encounter for palliative care: Secondary | ICD-10-CM

## 2017-10-23 MED ORDER — ATROPINE SULFATE 1 % OP SOLN
2.0000 [drp] | Freq: Three times a day (TID) | OPHTHALMIC | Status: DC | PRN
  Filled 2017-10-23: qty 2

## 2017-10-23 MED ORDER — MORPHINE SULFATE (CONCENTRATE) 10 MG/0.5ML PO SOLN: 2.5000 mg | ORAL | 0 refills | Status: AC | PRN

## 2017-10-23 MED ORDER — MORPHINE SULFATE (CONCENTRATE) 10 MG/0.5ML PO SOLN: 2.5000 mg | ORAL | Status: DC | PRN

## 2017-10-23 MED ORDER — POLYVINYL ALCOHOL 1.4 % OP SOLN: 2.0000 [drp] | OPHTHALMIC | Status: DC | PRN

## 2017-10-23 MED ORDER — BISACODYL 10 MG RE SUPP: 10.0000 mg | Freq: Every day | RECTAL | Status: DC | PRN

## 2017-10-23 MED ORDER — ATROPINE SULFATE 1 % OP SOLN: 2.0000 [drp] | Freq: Three times a day (TID) | OPHTHALMIC | 12 refills | Status: AC | PRN

## 2017-10-23 NOTE — Clinical Social Work Note (Signed)
Received CSW consult for hospice referral for pt. Per Palliative Care APNP, family requests [facility]. Will send referral as soon as Palliative Care Consult note is complete and will assist as needed.

## 2017-10-23 NOTE — Progress Notes (Signed)
Attempted to call hospice home but got voice mail. [facility] came for transport. IV access removed. Foley Catheter left in place. And [facility] had to borrow a oxygen tank. Transported all papers with them.

## 2017-10-23 NOTE — Consult Note (Signed)
Consultation Note Date: 10/23/2017   Patient Name: [name]  DOB: 10/25/1917  MRN: [id number]  Age / Sex: 84 y.o., female  PCP: Chipper Herb, MD Referring Physician: Isaac Bliss, Olam Idler*  Reason for Consultation: Establishing goals of care, Inpatient hospice referral and Psychosocial/spiritual support  HPI/Patient Profile: 84 y.o. female  with past medical history of atrial fibrillation on anticoagulation, diabetes, hypertension, osteoporosis with fractures of hips and shoulders, kyphosis, recently suffered a T8 compression fracture with a kyphoplasty done day prior to admission admitted on 10/21/2017 with acute metabolic encephalopathy likely related to CVA.   Clinical Assessment and Goals of Care: [name] is lying quietly in bed.  She does not open her eyes or try to interact with me in any meaningful way when I touch her or call her name.  Present today at bedside is son Sammie Schermerhorn, and his wife of 31 years Wells Guiles.  We talked in detail about [name] psychosocial and medical history.  We talked about prognosis with permission.  We talked about un burdening [name] from medications and treatments that are not changing outcomes for her.  Daughter-in-law  Wells Guiles states that [name] "is ready".  She states that [name] has been praying for God to take her after her last fracture.  We talked about the benefits of residential hospice.  Family agrees to unburden [name] from IV fluids.  I share that residential hospice is excellent at symptom management, and caring for people who are near end of life.  Son, Tosca Pletz, is contact person.  They shared that they do not have a cell phone.  They request that hospice Call [name] room.  Healthcare power of attorney NEXT OF KIN -son [name] is an only child.  Mrs. Amy is 1 of 13 siblings, only 2 still living.   SUMMARY  OF RECOMMENDATIONS   Comfort and dignity at end of life Requesting residential hospice  Code Status/Advance Care Planning:  DNR  Symptom Management:   By mouth morphine for pain and breathlessness  Oral atropine for secretions  Comfort measures including turning and eyedrops.  Palliative Prophylaxis:   Oral Care and Turn Reposition  Additional Recommendations (Limitations, Scope, Preferences):  Full Comfort Care  Psycho-social/Spiritual:   Desire for further Chaplaincy support:yes  Additional Recommendations: Caregiving  Support/Resources and Education on Hospice  Prognosis:   Hours - Days  Discharge Planning: Requesting comfort and dignity at end of life, residential hospice at [facility]      Primary Diagnoses: Present on Admission: . Atrial fibrillation ([place]) . Hypertension . Hyperlipidemia . Closed wedge compression fracture of T8 vertebra ([place]) . Hypertensive heart and kidney disease without heart failure and with chronic kidney disease stage III ([place]) . Acute encephalopathy   I have reviewed the medical record, interviewed the patient and family, and examined the patient. The following aspects are pertinent.  Past Medical History:  Diagnosis Date  . Atrial fibrillation ([place])   . Basal cell cancer    right shoulder  .  Diabetes mellitus   . Fatigue   . Hypertension   . Kyphosis    Social History   Socioeconomic History  . Marital status: Widowed    Spouse name: None  . Number of children: None  . Years of education: None  . Highest education level: None  Social Needs  . Financial resource strain: None  . Food insecurity - worry: None  . Food insecurity - inability: None  . Transportation needs - medical: None  . Transportation needs - non-medical: None  Occupational History  . None  Tobacco Use  . Smoking status: Never Smoker  . Smokeless tobacco: Never Used  Substance and Sexual Activity  . Alcohol use: No  . Drug use: No  .  Sexual activity: None  Other Topics Concern  . None  Social History Narrative  . None   Family History  Problem Relation Age of Onset  . Diabetes Mother   . Heart disease Mother    Scheduled Meds: . enoxaparin (LOVENOX) injection  30 mg Subcutaneous Q24H   Continuous Infusions: . sodium chloride 1,000 mL (10/23/17 1015)   PRN Meds:.acetaminophen **OR** acetaminophen, ondansetron **OR** ondansetron (ZOFRAN) IV, senna-docusate Medications Prior to Admission:  Prior to Admission medications   Medication Sig Start Date End Date Taking? Authorizing Provider  acetaminophen (TYLENOL) 500 MG tablet Take 1,000 mg by mouth every 4 (four) hours as needed.   Yes [provider]  apixaban (ELIQUIS) 2.5 MG TABS tablet TAKE (1) TABLET TWICE A DAY. 03/29/17  Yes Chipper Herb, MD  diltiazem [facility] CD) 240 MG 24 hr capsule TAKE (1) CAPSULE DAILY 03/29/17  Yes Chipper Herb, MD  metFORMIN (GLUCOPHAGE-XR) 500 MG 24 hr tablet TAKE 1 TABLET ONCE DAILY WITH LARGEST MEAL. 03/29/17  Yes Chipper Herb, MD  sertraline (ZOLOFT) 100 MG tablet Take 1 tablet (100 mg total) by mouth daily. 03/29/17  Yes Chipper Herb, MD   Allergies  Allergen Reactions  . Ace Inhibitors   . Codeine Nausea And Vomiting  . Vytorin [Ezetimibe-Simvastatin] Other (See Comments)    Pain in legs   Review of Systems  Unable to perform ROS: Patient unresponsive    Physical Exam  Constitutional: No distress.  Appears frail, acutely/chronically ill, does not respond to voice or touch in any meaningful way  HENT:  Head: Atraumatic.  Some temporal wasting  Cardiovascular: Normal rate.  Pulmonary/Chest: Effort normal. No respiratory distress.  Abdominal: Soft. She exhibits no distension.  Musculoskeletal: She exhibits no edema.  Muscle wasting  Neurological:  Does not respond to voice or touch in any meaningful way  Skin: Skin is warm and dry.  Nursing note and vitals reviewed.   Vital Signs: BP 122/84 (BP  Location: Left Arm)   Pulse 89   Temp 100.1 F (37.8 C) (Axillary)   Resp 18   Ht 5' (1.524 m)   Wt 46.3 kg (102 lb)   SpO2 97%   BMI 19.92 kg/m  Pain Assessment: PAINAD POSS *See Group Information*: 1-Acceptable,Awake and alert     SpO2: SpO2: 97 % O2 Device:SpO2: 97 % O2 Flow Rate: .O2 Flow Rate (L/min): 2 L/min  IO: Intake/output summary:   Intake/Output Summary (Last 24 hours) at 10/23/2017 1109 Last data filed at 10/23/2017 0800 Gross per 24 hour  Intake 1420.83 ml  Output 500 ml  Net 920.83 ml    LBM: Last BM Date: 10/20/17 Baseline Weight: Weight: 46.3 kg (102 lb) Most recent weight: Weight: 46.3 kg (102 lb)  Palliative Assessment/Data:   Flowsheet Rows     Most Recent Value  Intake Tab  Referral Department  Hospitalist  Unit at Time of Referral  Med/Surg Unit  Palliative Care Primary Diagnosis  Neurology  Date Notified  10/21/17  Palliative Care Type  New Palliative care  Reason for referral  Clarify Goals of Care, Psychosocial or Spiritual support, Counsel Regarding Hospice  Date of Admission  10/21/17  Date first seen by Palliative Care  10/23/17  # of days Palliative referral response time  2 Day(s)  # of days IP prior to Palliative referral  0  Clinical Assessment  Palliative Performance Scale Score  20%  Pain Max last 24 hours  Not able to report  Pain Min Last 24 hours  Not able to report  Dyspnea Max Last 24 Hours  Not able to report  Dyspnea Min Last 24 hours  Not able to report  Psychosocial & Spiritual Assessment  Palliative Care Outcomes  Patient/Family meeting held?  Yes  Who was at the meeting?  son and DIL at bedside  Patient/Family wishes: Interventions discontinued/not started   Mechanical Ventilation, Tube feedings/TPN      Time In: 1015 Time Out: 1125 Time Total: 75 minutes Greater than 50%  of this time was spent counseling and coordinating care related to the above assessment and plan.  Signed by: Drue Novel, NP     Please contact Palliative Medicine Team phone at [phone] for questions and concerns.  For individual provider: See Shea Evans

## 2017-10-23 NOTE — Discharge Summary (Signed)
Physician Discharge Summary  [name] WCB:[id number] DOB: March 23, 1918 DOA: 10/21/2017  PCP: Chipper Herb, MD  Admit date: 10/21/2017 Discharge date: 10/23/2017  Time spent: 45 minutes  Recommendations for Outpatient Follow-up:  -To be discharged to residential hospice today for end-of-life care.  Discharge Diagnoses:  Principal Problem:   Acute metabolic encephalopathy Active Problems:   Diabetes mellitus (HCC)   Atrial fibrillation (HCC)   Hypertension   Hyperlipidemia   Closed wedge compression fracture of T8 vertebra (HCC)   Hypertensive heart and kidney disease without heart failure and with chronic kidney disease stage III (HCC)   CVA (cerebral vascular accident) ([place])   Acute encephalopathy   Discharge Condition: Guarded  Filed Weights   10/21/17 1019  Weight: 46.3 kg (102 lb)    History of present illness:  [name] is a 84 y.o. female with history of atrial fibrillation, diabetes, hypertension who recently suffered a T8 compression fracture and was having significant pain and had a kyphoplasty done day prior to admission on 10/20/2017.  Her son at bedside gives all the history as patient is currently confused.  Son states that early this morning he noticed she had woken up and was going to the bathroom ever since then she has been confused with stuttering speech and had difficulty ambulating.  He brought her to the hospital for evaluation.  She is a bit hypertensive with a blood pressure around 170/86, otherwise vital signs are stable.  ABG has a pH of 7.44 with a PCO2 of 31 and a PO2 of 89.  Chemistries are within normal limits with the exception of a glucose of 156, troponin is slightly elevated at 0.03, WBCs are 11.3, her hemoglobin is 9.9.  Urinalysis and chest x-ray are negative for infection, CT scan of the head is negative for acute changes, EKG shows chronic A. fib but no acute ischemic changes.  Of note patient's Eliquis has been held 3 days prior to  kyphoplasty.  Admission is requested for potential stroke workup.    Hospital Course:   Acute metabolic encephalopathy -We assume she has had a CVA.  She has a history of A. fib and had been off her Eliquis for about 4 days due to a kyphoplasty that was performed on 3/8. -Family has, appropriately so, refused aggressive interventions and imaging studies given her age. -They agreedo hospice care, patient will be transferred to residential hospice facility. -Of note infectious sources have been ruled out, CT scan of the head was negative, no electrolyte abnormalities that could explain her confusion.  Atrial fibrillation -Currently rate controlled, off anticoagulation given recent kyphoplasty and n.p.o. state.  Hypertension -BP remains slightly elevated but within range for allowable permissive hypertension given presumed acute CVA.    Procedures:  None  Consultations:  Palliative care  Discharge Instructions  Discharge Instructions    Increase activity slowly   Complete by:  As directed      Allergies as of 10/23/2017      Reactions   Ace Inhibitors    Codeine Nausea And Vomiting   Vytorin [ezetimibe-simvastatin] Other (See Comments)   Pain in legs      Medication List    STOP taking these medications   apixaban 2.5 MG Tabs tablet Commonly known as:  ELIQUIS   diltiazem 240 MG 24 hr capsule Commonly known as:  CARDIZEM CD   metFORMIN 500 MG 24 hr tablet Commonly known as:  GLUCOPHAGE-XR   sertraline 100 MG tablet Commonly known  as:  ZOLOFT     TAKE these medications   acetaminophen 500 MG tablet Commonly known as:  TYLENOL Take 1,000 mg by mouth every 4 (four) hours as needed.   atropine 1 % ophthalmic solution Place 2 drops under the tongue 3 (three) times daily as needed (increased oral secretions).   morphine CONCENTRATE 10 MG/0.5ML Soln concentrated solution Take 0.13 mLs (2.6 mg total) by mouth every 2 (two) hours as needed for moderate pain  (breathlessness).      Allergies  Allergen Reactions  . Ace Inhibitors   . Codeine Nausea And Vomiting  . Vytorin [Ezetimibe-Simvastatin] Other (See Comments)    Pain in legs      The results of significant diagnostics from this hospitalization (including imaging, microbiology, ancillary and laboratory) are listed below for reference.    Significant Diagnostic Studies: Dg Chest 1 View  Result Date: 10/21/2017 CLINICAL DATA:  Altered mental status EXAM: CHEST 1 VIEW COMPARISON:  01/14/2013 FINDINGS: There is hyperinflation of the lungs compatible with COPD. Cardiomegaly. No confluent airspace opacities or effusions. Degenerative changes in the shoulders. No acute bony abnormality. IMPRESSION: COPD.  Cardiomegaly.  No active cardiopulmonary disease. Electronically Signed   By: Rolm Baptise M.D.   On: 10/21/2017 11:23   Ct Head Wo Contrast  Result Date: 10/21/2017 CLINICAL DATA:  Altered level of consciousness. EXAM: CT HEAD WITHOUT CONTRAST TECHNIQUE: Contiguous axial images were obtained from the base of the skull through the vertex without intravenous contrast. COMPARISON:  01/14/2013 FINDINGS: Brain: Severe atrophy and chronic small vessel disease. No acute intracranial abnormality. Specifically, no hemorrhage, hydrocephalus, mass lesion, acute infarction, or significant intracranial injury. Vascular: No hyperdense vessel or unexpected calcification. Skull: No acute calvarial abnormality. Sinuses/Orbits: Opacified right maxillary sinus and right frontal sinus. Mucosal disease throughout the remainder of the paranasal sinuses. Mastoid air cells are clear. Other: None IMPRESSION: Severe atrophy, chronic microvascular disease. No acute intracranial abnormality. Severe chronic pansinusitis. Electronically Signed   By: Rolm Baptise M.D.   On: 10/21/2017 11:22   Mr Thoracic Spine Wo Contrast  Result Date: 10/11/2017 CLINICAL DATA:  Chronic mid and lower back pain. Prior compression fractures.  EXAM: MRI THORACIC SPINE WITHOUT CONTRAST TECHNIQUE: Multiplanar, multisequence MR imaging of the thoracic spine was performed. No intravenous contrast was administered. COMPARISON:  12/01/2014 FINDINGS: Alignment: Exaggerated thoracic kyphosis. Grade 1 anterolisthesis of C7 on T1, unchanged. Vertebrae: Interval augmentation of T6 vertebral compression fracture. Chronic T7, T9, and T12 compression fractures status post augmentation. New marrow edema superiorly in the T8 vertebral body with mildly progressive vertebral body height loss, now 45%. No suspicious osseous lesion. Cord:  Normal signal and morphology. Paraspinal and other soft tissues: Unremarkable. Disc levels: Diffuse lower cervical spine disc degeneration is only imaged sagittally with disc bulging noted at C5-6 and C6-7 resulting in suspected mild spinal stenosis. There is mild diffuse thoracic disc degeneration including a tiny central disc protrusion at T2-3 without resultant spinal stenosis or spinal cord mass effect. Mild thoracic facet arthrosis is noted without evidence of high-grade neural foraminal stenosis. IMPRESSION: Acute on chronic T8 compression fracture with 45% height loss. Electronically Signed   By: Logan Bores M.D.   On: 10/11/2017 11:49   Mr Lumbar Spine Wo Contrast  Result Date: 10/11/2017 CLINICAL DATA:  Mid and lower back pain. History of compression fractures. EXAM: MRI LUMBAR SPINE WITHOUT CONTRAST TECHNIQUE: Multiplanar, multisequence MR imaging of the lumbar spine was performed. No intravenous contrast was administered. COMPARISON:  Thoracic spine MRI 12/01/2014.  Lumbar spine MRI 10/06/2010. FINDINGS: The study is motion degraded, severely so on the axial T2 sequence. Segmentation: Standard. Alignment: Lumbar dextroscoliosis. Trace retrolisthesis of L2 on L3 and L3 on L4. Vertebrae: Chronic T12, L2, and L3 compression fracture status post augmentation. Unchanged chronic L4 inferior endplate compression fracture. No  evidence of acute fracture. No suspicious osseous lesion. Conus medullaris and cauda equina: Conus extends to the lower T12 level. Conus and cauda equina appear normal. Paraspinal and other soft tissues: No significant abnormality identified. Disc levels: Disc desiccation throughout the lumbar spine. Chronic moderate disc space narrowing at L4-5 and L5-S1. T12-L1: Minimal disc bulging without stenosis. L1-2: Minimal disc bulging without stenosis. L2-3: Mild disc bulging without stenosis. L3-4: Disc bulging and mild facet and ligamentum flavum hypertrophy result in mild bilateral lateral recess stenosis and moderate right neural foraminal stenosis, similar to prior. L4-5: Prior left laminectomy. Disc bulging, endplate spurring, disc space height loss, and mild facet hypertrophy result in mild bilateral lateral recess stenosis and moderate bilateral neural foraminal stenosis, similar to prior. L5-S1: Mild disc bulging, small central disc protrusion, and mild facet hypertrophy result in borderline left lateral recess stenosis without spinal or neural foraminal stenosis, similar to prior. IMPRESSION: 1. Chronic lumbar compression fractures without acute fracture. 2. Similar appearance of diffuse lumbar disc degeneration within limitations of motion artifact. Mild lateral recess and moderate neural foraminal stenosis at L3-4 and L4-5. Electronically Signed   By: Logan Bores M.D.   On: 10/11/2017 12:01   Ir Kypho Thoracic With Bone Biopsy  Result Date: 10/20/2017 CLINICAL DATA:  Painful subacute thoracic T8 compression fracture. EXAM: KYPHOPLASTY AT THORACIC T8 TECHNIQUE: The procedure, risks (including but not limited to bleeding, infection, organ damage), benefits, and alternatives were explained to the patient. Questions regarding the procedure were encouraged and answered. The patient understands and consents to the procedure. The patient was placed prone on the fluoroscopic table. The skin overlying the  midthoracic region was then prepped and draped in the usual sterile fashion. Maximal barrier sterile technique was utilized including caps, mask, sterile gowns, sterile gloves, sterile drape, hand hygiene and skin antiseptic. Intravenous Fentanyl and Versed were administered as conscious sedation during continuous cardiorespiratory monitoring by the radiology RN, with a total moderate sedation time of28 minutes. As antibiotic prophylaxis, cefazolin 2 g was ordered pre-procedure and administered intravenously within !one hour! of incision. The right pedicle at thoracic T8 was then infiltrated with 1% lidocaine followed by the advancement of a Kyphon trocar needle through the right pedicle into the posterior one-third. The trocar was removed and the osteo drill was advanced to the anterior third of the vertebral body. The osteo drill was retracted. Through the working cannula, a Kyphon inflatable bone tamp 15 x 3 was advanced and positioned with the distal marker 5 mm from the anterior aspect of the cortex. Crossing of the midline was not seen on the AP projection. At this time, the balloon was expanded using contrast via a Kyphon inflation syringe device via micro tubing. In similar fashion, the left pedicle was infiltrated with 1% lidocaine followed by the advancement of a second Kyphon trocar needle through the left pedicle into the posterior third of the vertebral body. The osteo drill was coaxially advanced to the anterior right third. The osteo drill was exchanged for a Kyphon inflatable bone tamp 15x 3, advanced to the 5 mm of the anterior aspect of the cortex. The balloon was then expanded using contrast as above. Inflations were continued until there  was near apposition across the midline and with the superior and the inferior end plates. At this time, methylmethacrylate mixture was reconstituted in the Kyphon bone mixing device system. This was then loaded into the delivery mechanism, attached to Kyphon bone  fillers. The balloons were deflated and removed followed by the instillation of methylmethacrylate mixture with excellent filling in the AP and lateral projections. No extravasation was noted in the disk spaces or posteriorly into the spinal canal. No epidural venous contamination was seen. The patient tolerated the procedure well. There were no acute complications. The working cannulae and the bone filler were then retrieved and removed. COMPLICATIONS: COMPLICATIONS None immediate. IMPRESSION: 1. Status post vertebral body augmentation using balloon kyphoplasty at thoracic T8 as described without event. 2. Per CMS PQRS reporting requirements (PQRS Measure 24): Given the patient's age of greater than 57 and the fracture site (hip, distal radius, or spine), the patient should be tested for osteoporosis using DXA, and the appropriate treatment considered based on the DXA results. Electronically Signed   By: Lucrezia Europe M.D.   On: 10/20/2017 14:39   Ir Radiologist Eval & Mgmt  Result Date: 10/18/2017 Please refer to notes tab for details about interventional procedure. (Op Note)   Microbiology: Recent Results (from the past 240 hour(s))  Urine culture     Status: None   Collection Time: 10/21/17 11:51 AM  Result Value Ref Range Status   Specimen Description   Final    URINE, CATHETERIZED Performed at [facility], [address] [place], [place] [address]    Special Requests   Final    NONE Performed at [facility], [address] [place], [place] [address]    Culture   Final    NO GROWTH Performed at [facility], [place] [address] [place], [place] [address]    Report Status 10/22/2017 FINAL  Final     Labs: Basic Metabolic Panel: Recent Labs  Lab 10/21/17 1123  NA 140  K 3.7  CL 106  CO2 21*  GLUCOSE 156*  BUN 25*  CREATININE 0.91  CALCIUM 9.5   Liver Function Tests: Recent Labs  Lab 10/21/17 1123  AST 17  ALT 10*  ALKPHOS 115  BILITOT 0.9  PROT 7.2  ALBUMIN 3.1*     No results for input(s): LIPASE, AMYLASE in the last 168 hours. No results for input(s): AMMONIA in the last 168 hours. CBC: Recent Labs  Lab 10/20/17 0953 10/21/17 1123  WBC 11.0* 11.3*  NEUTROABS  --  9.8*  HGB 9.8* 9.9*  HCT 30.5* 31.1*  MCV 83.3 83.4  PLT 330 387   Cardiac Enzymes: Recent Labs  Lab 10/21/17 1123  TROPONINI 0.03*   BNP: BNP (last 3 results) No results for input(s): BNP in the last 8760 hours.  ProBNP (last 3 results) No results for input(s): PROBNP in the last 8760 hours.  CBG: Recent Labs  Lab 10/20/17 0953 10/21/17 1027  GLUCAP 135* 144*       Signed:  Lelon Frohlich  [facility] Pager: [phone] 10/23/2017, 1:16 PM

## 2017-10-23 NOTE — Clinical Social Work Note (Signed)
Clinical Social Work Assessment  Patient Details  Name: [name] MRN: [id number] Date of Birth: June 29, 1918  Date of referral:  10/23/17               Reason for consult:  End of Life/Hospice                Permission sought to share information with:  Chartered certified accountant granted to share information::  Yes, Verbal Permission Granted  Name::        Agency::     Relationship::  [facility] Information:     Housing/Transportation Living arrangements for the past 2 months:  Single Family Home Source of Information:  Medical Team Patient Interpreter Needed:  None Criminal Activity/Legal Involvement Pertinent to Current Situation/Hospitalization:  No - Comment as needed Significant Relationships:  Adult Children Lives with:  Adult Children Do you feel safe going back to the place where you live?    Need for family participation in patient care:  Yes (Comment)  Care giving concerns: Pt appropriate for end of life care.   Social Worker assessment / plan: Pt is a 84 year old female referred to [place] for hospice referral. Per APNP, family requests [facility]. Will initiate referrals once Palliative Care documentation is complete.   Employment status:  Retired Nurse, adult PT Recommendations:  Not assessed at this time Information / Referral to community resources:     Patient/Family's Response to care: Family accepting of care.  Patient/Family's Understanding of and Emotional Response to Diagnosis, Current Treatment, and Prognosis: Family appears to have a good understanding of pt's diagnosis and prognosis. Family have elected for pt to receive hospice care. Palliative Care APNP and Pastoral Care following for emotional support.  Emotional Assessment Appearance:  Appears stated age Attitude/Demeanor/Rapport:  Unresponsive Affect (typically observed):  Unable to Assess Orientation:     Alcohol / Substance use:  Not Applicable Psych involvement (Current and /or in the community):  No (Comment)  Discharge Needs  Concerns to be addressed:  Other (Comment Required(Hospice referral) Readmission within the last 30 days:  No Current discharge risk:  None Barriers to Discharge:  No Barriers Identified   Shade Flood, LCSW 10/23/2017, 11:22 AM

## 2017-10-23 NOTE — Clinical Social Work Note (Signed)
[name], [name] #[id number] (CSN: [id number]) (84 y.o. F) (Adm: 10/21/17)  [id number]  PCP   [facility] W  Demographics  Comment    Last edited by  on at   Address: Home Phone: Work Phone: Mobile Phone:  [place] [address]   [phone] -- [phone]  SSN: Insurance: Marital Status: Religion:  [id number] [place]  Patient Ethnicity & Race   Ethnic Group Patient Race  Not Hispanic or Latino White or Caucasian  Documents Filed to Patient   Power of Attorney Living Will Clinical Unknown Study Attachment Consent Form ABN Waiver After Visit Summary Lab Result Scan Code Status MyChart Status Advance Care Planning  Not on File Not on File Not on File Not on File Filed Not on File Filed Filed DNR [Updated on 10/21/17 1452] Declined Jump to the Activity  Admission Information   Attending Provider Admitting Provider Admission Type Admission Date/Time  Isaac Bliss, Rayford Halsted, MD Erline Hau, MD Emergency 10/21/17 1006  Discharge Date Hospital Service Auth/Cert Status Service Area   Internal Medicine Incomplete [place]  Unit Room/Bed Admission Status   AP-DEPT 300 A330/A330-01 Admission (Confirmed)   Hospital Account   Name Acct ID Class Status Primary Coverage  [name], [name] [id number] [place]      Guarantor Account (for Hospital Account [id number])   Name Relation to [place]? Acct Type  Synetta Shadow Self [facility]SA Yes Personal/Family  Address Phone    [address] [place], [place] [address] [phone])        Coverage Information (for Hospital Account [id number])   F/O Payor/Plan Precert #  [facility] #  [name], [name] [id number]  [place]  [address] [address] [phone]

## 2017-10-31 DIAGNOSIS — Z515 Encounter for palliative care: Secondary | ICD-10-CM

## 2017-10-31 DIAGNOSIS — Z7189 Other specified counseling: Secondary | ICD-10-CM

## 2017-11-13 DEATH — deceased

## 2018-03-22 ENCOUNTER — Ambulatory Visit: Payer: Medicare Other | Admitting: Family Medicine

## 2019-04-21 IMAGING — MR MR LUMBAR SPINE W/O CM
4 of 5 series · 13 of 48 positions shown · non-contrast
Comparison: Thoracic spine MRI 12/01/2014. Lumbar spine MRI
10/06/2010.

CLINICAL DATA: Mid and lower back pain. History of compression
fractures.

EXAM:
MRI LUMBAR SPINE WITHOUT CONTRAST
TECHNIQUE: Multiplanar, multisequence MR imaging of the lumbar spine was
performed. No intravenous contrast was administered.

[Series 7: T2 · sagittal · 4.0mm · 0.41mm/px · 4 of 30 slices shown (1 of 3)]
[im 1/30]
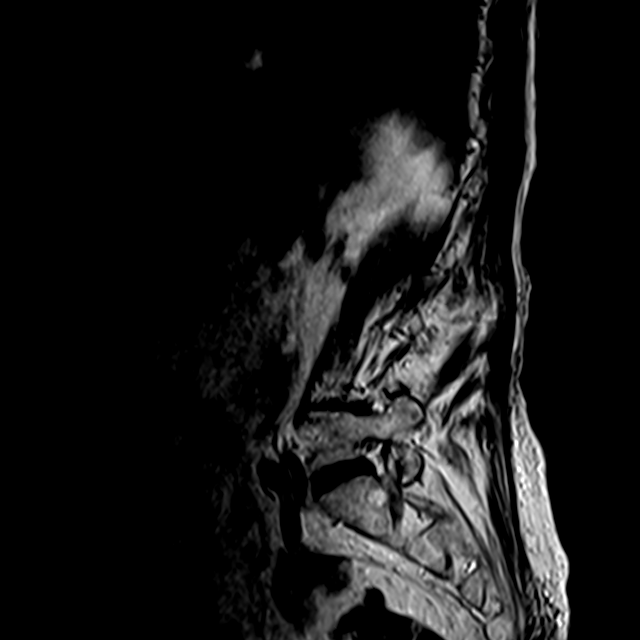
[im 5/30]
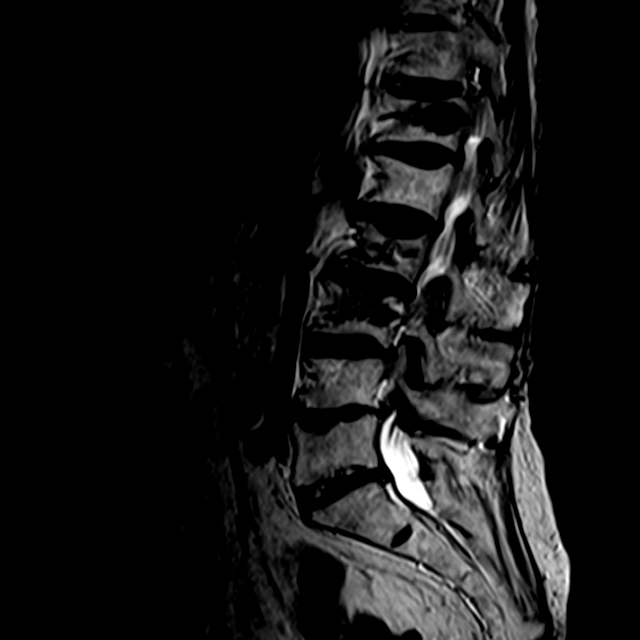
[im 17/30]
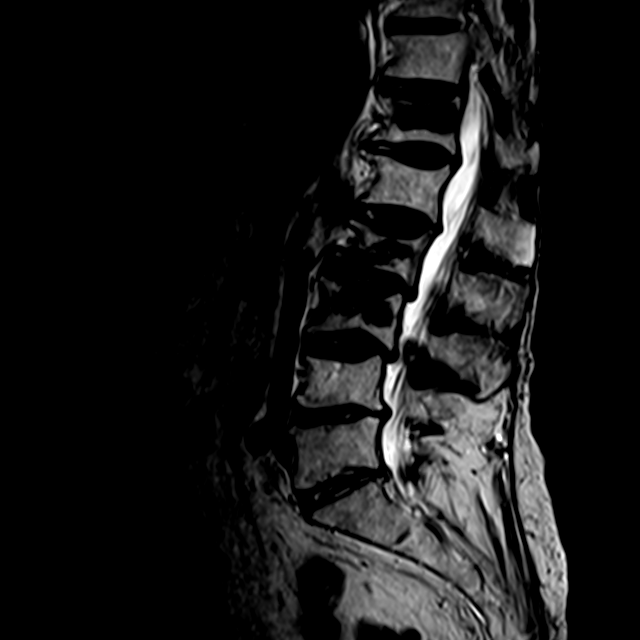
[im 25/30]
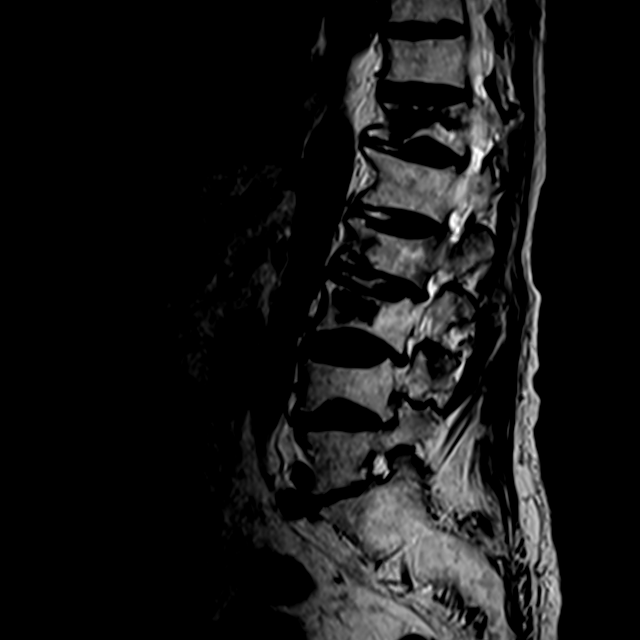

[Series 8: T1 · sagittal · 4.0mm · 0.41mm/px · 3 of 30 slices shown]
[im 4/30]
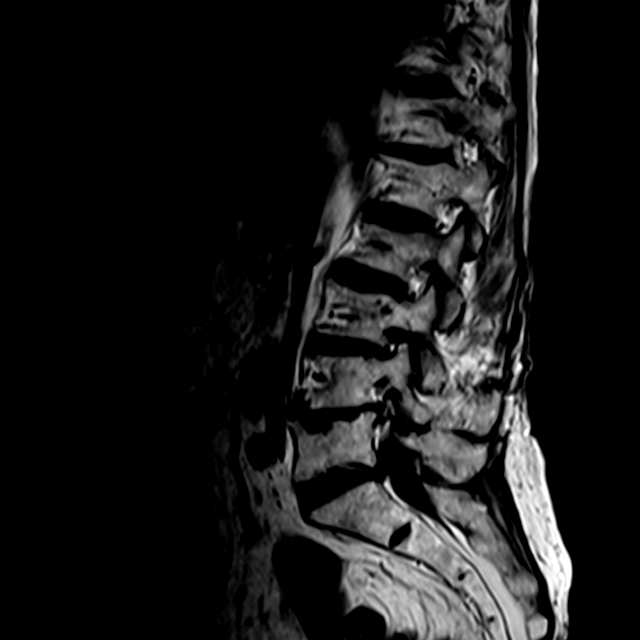
[im 15/30]
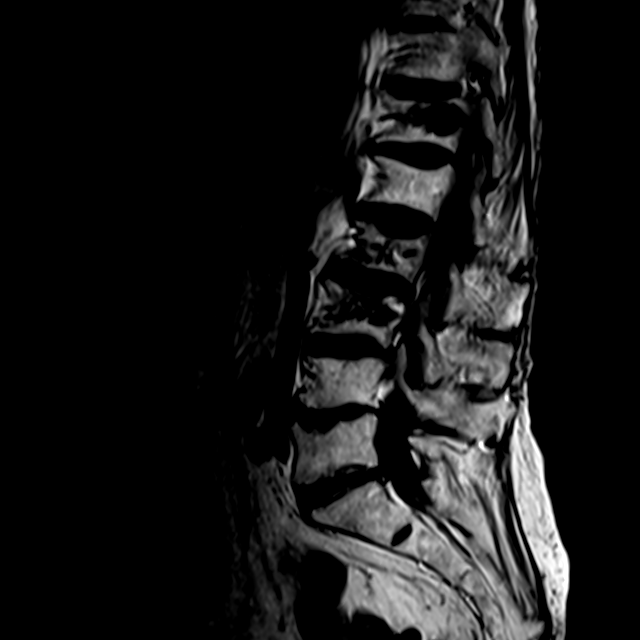
[im 26/30]
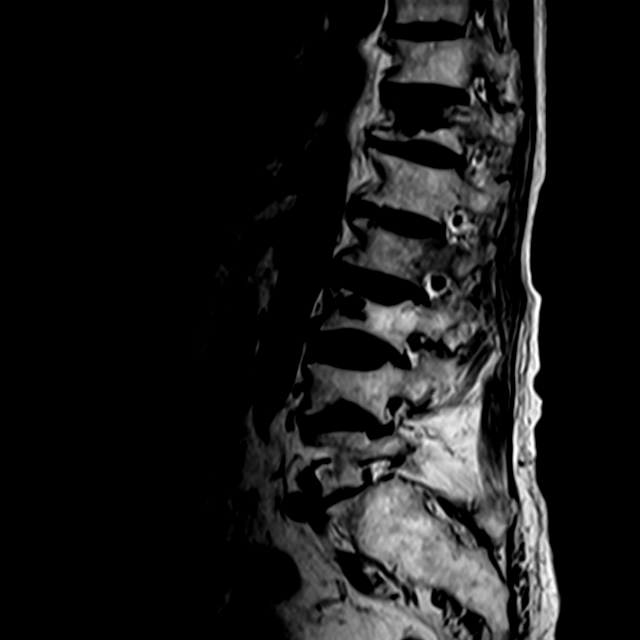

[Series 9: T2 · sagittal · 4.0mm · 0.41mm/px · 3 of 18 slices shown (2 of 3)]
[im 1/18]
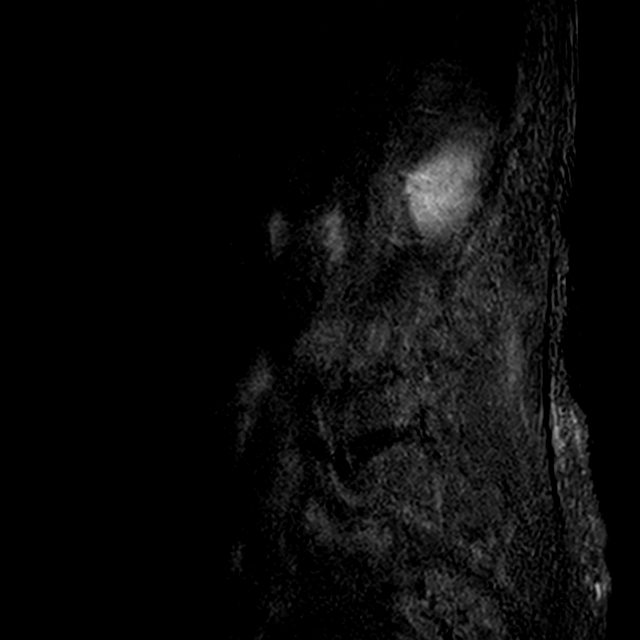
[im 9/18]
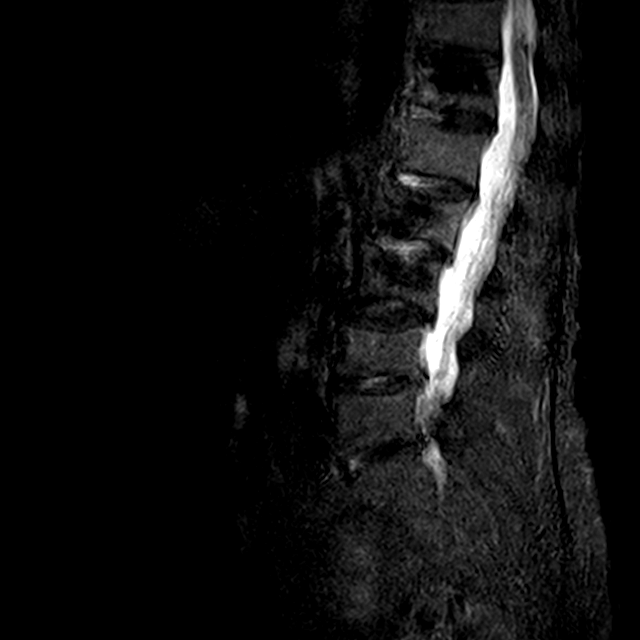
[im 18/18]
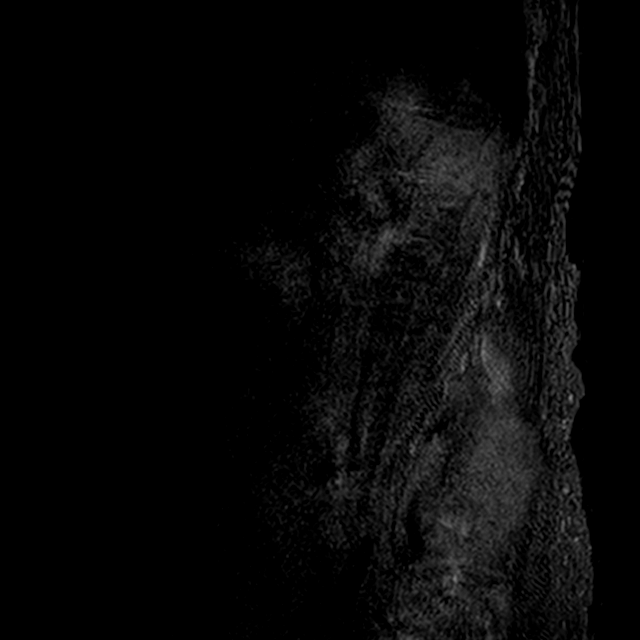

[Series 10: T2 · axial · 4.0mm · 0.27mm/px · z∈[-454,-308]mm · 3 of 43 slices shown (3 of 3)]
[im 8/43]
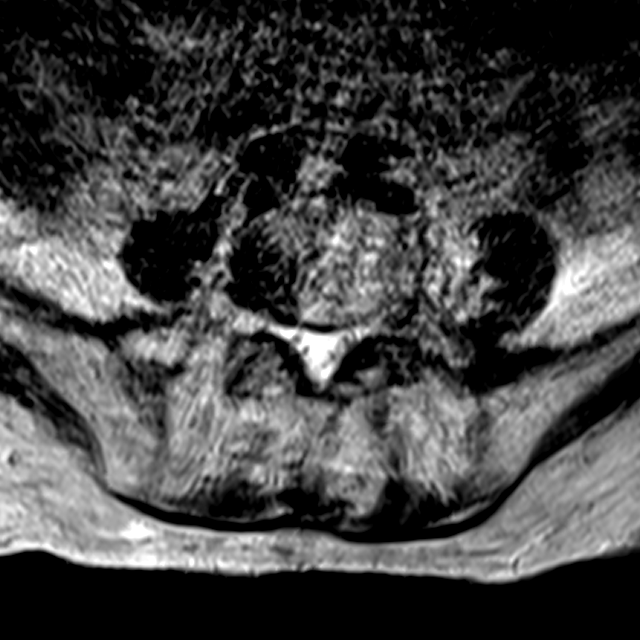
[im 22/43]
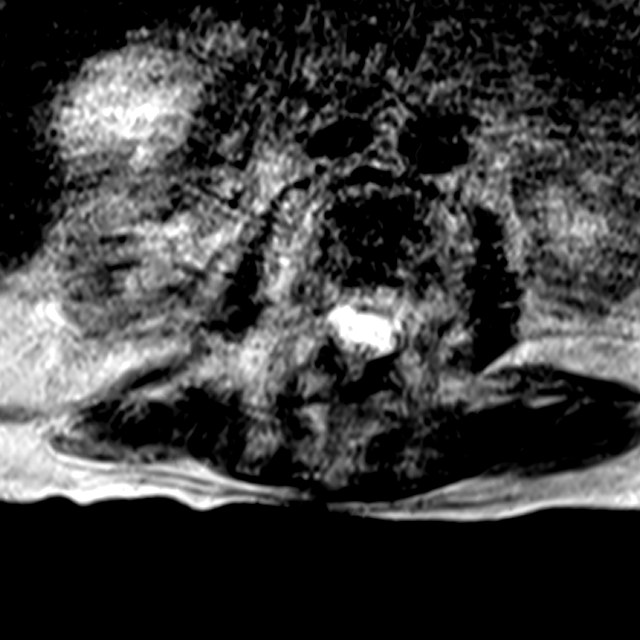
[im 36/43]
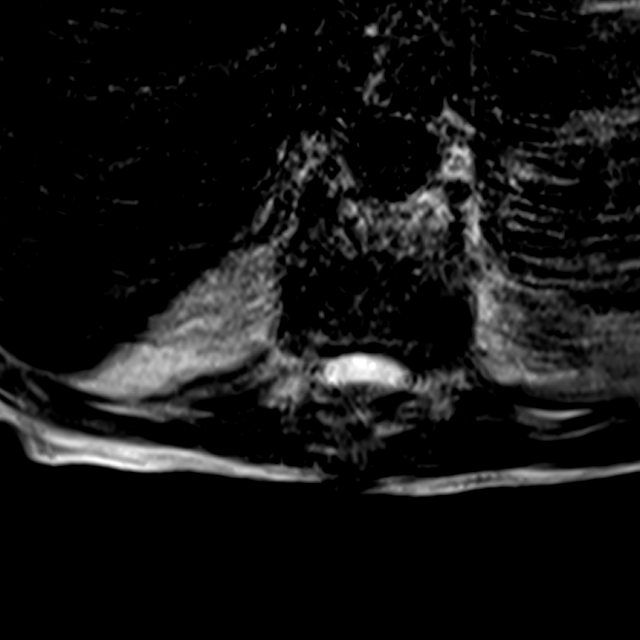

[13 of 48 positions shown; findings below may reference images not displayed]

FINDINGS: The study is motion degraded, severely so on the axial T2 sequence.

Segmentation: Standard.

Alignment: Lumbar dextroscoliosis. Trace retrolisthesis of L2 on L3
and L3 on L4.

Vertebrae: Chronic T12, L2, and L3 compression fracture status post
augmentation. Unchanged chronic L4 inferior endplate compression
fracture. No evidence of acute fracture. No suspicious osseous
lesion.

Conus medullaris and cauda equina: Conus extends to the lower T12
level. Conus and cauda equina appear normal.

Paraspinal and other soft tissues: No significant abnormality
identified.

Disc levels:

Disc desiccation throughout the lumbar spine. Chronic moderate disc
space narrowing at L4-5 and L5-S1.

T12-L1: Minimal disc bulging without stenosis.

L1-2: Minimal disc bulging without stenosis.

L2-3: Mild disc bulging without stenosis.

L3-4: Disc bulging and mild facet and ligamentum flavum hypertrophy
result in mild bilateral lateral recess stenosis and moderate right
neural foraminal stenosis, similar to prior.

L4-5: Prior left laminectomy. Disc bulging, endplate spurring, disc
space height loss, and mild facet hypertrophy result in mild
bilateral lateral recess stenosis and moderate bilateral neural
foraminal stenosis, similar to prior.

L5-S1: Mild disc bulging, small central disc protrusion, and mild
facet hypertrophy result in borderline left lateral recess stenosis
without spinal or neural foraminal stenosis, similar to prior.
IMPRESSION: 1. Chronic lumbar compression fractures without acute fracture.
2. Similar appearance of diffuse lumbar disc degeneration within
limitations of motion artifact. Mild lateral recess and moderate
neural foraminal stenosis at L3-4 and L4-5.

## 2019-04-21 IMAGING — MR MR THORACIC SPINE W/O CM
4 of 6 series · 14 of 48 positions shown · non-contrast
Comparison: 12/01/2014

CLINICAL DATA: Chronic mid and lower back pain. Prior compression
fractures.

EXAM:
MRI THORACIC SPINE WITHOUT CONTRAST
TECHNIQUE: Multiplanar, multisequence MR imaging of the thoracic spine was
performed. No intravenous contrast was administered.

[Series 1: T1 · sagittal · 4.0mm · 0.66mm/px · 3 of 13 slices shown]
[im 1/13]
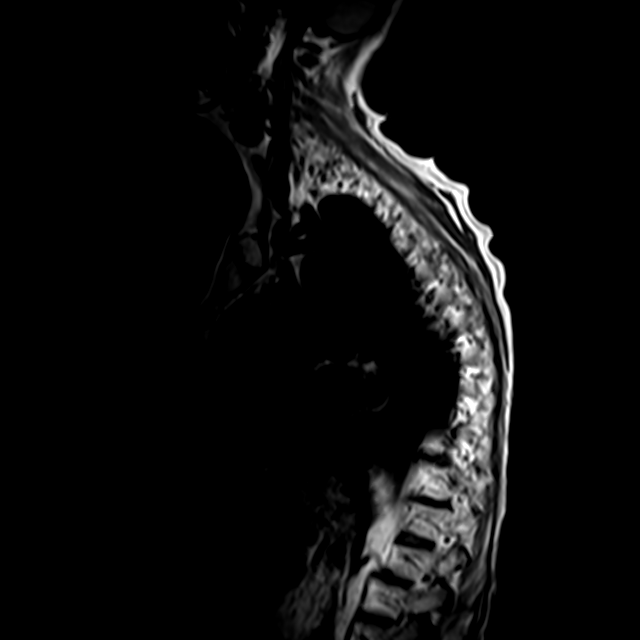
[im 7/13]
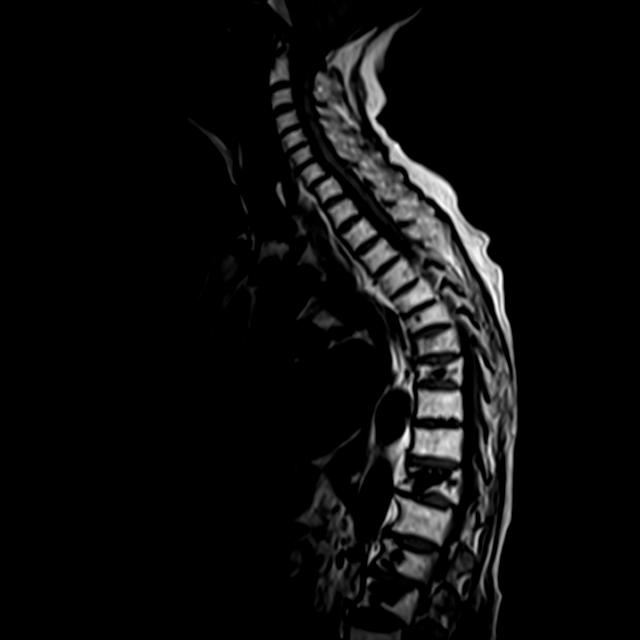
[im 13/13]
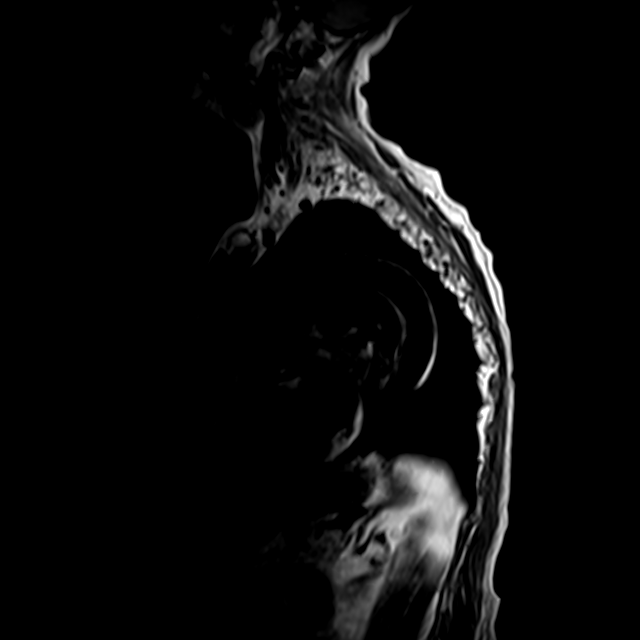

[Series 2: T2 · sagittal · 3.0mm · 0.59mm/px · 5 of 15 slices shown (1 of 3)]
[im 1/15]
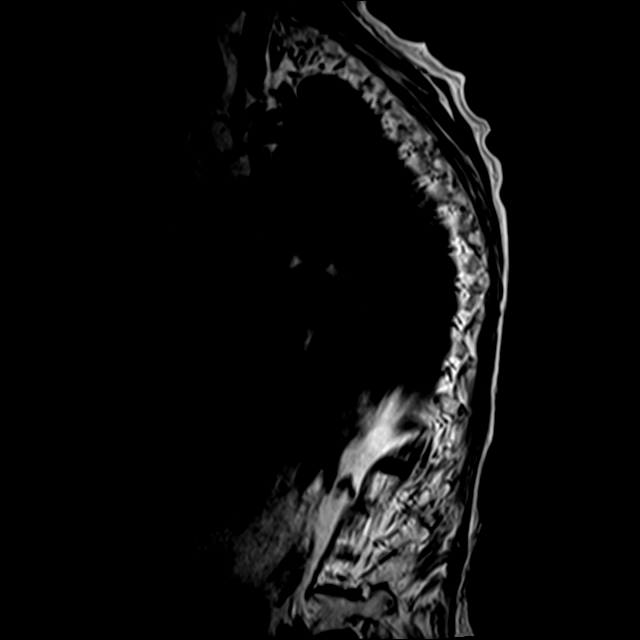
[im 3/15]
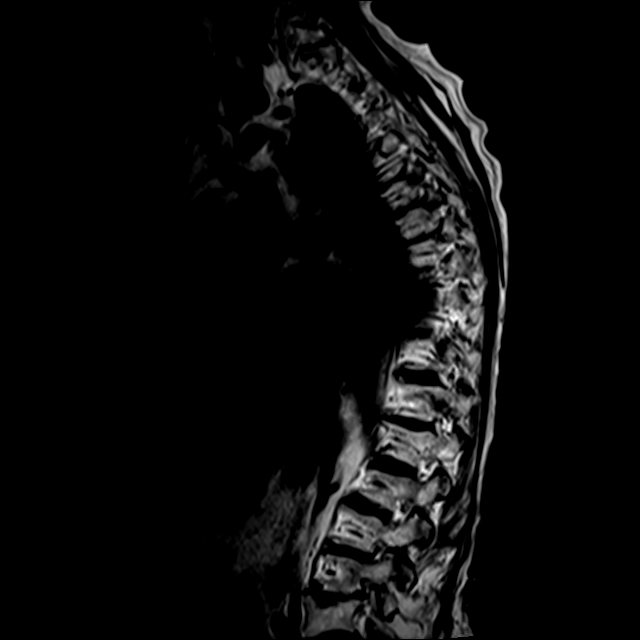
[im 6/15]
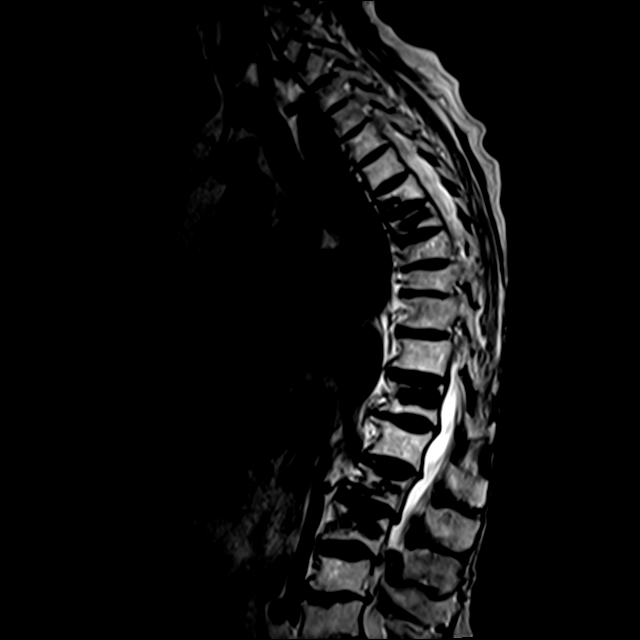
[im 9/15]
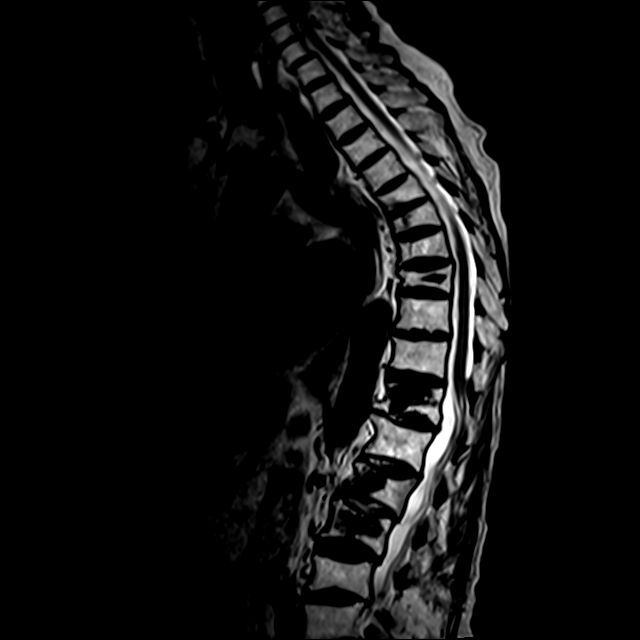
[im 15/15]
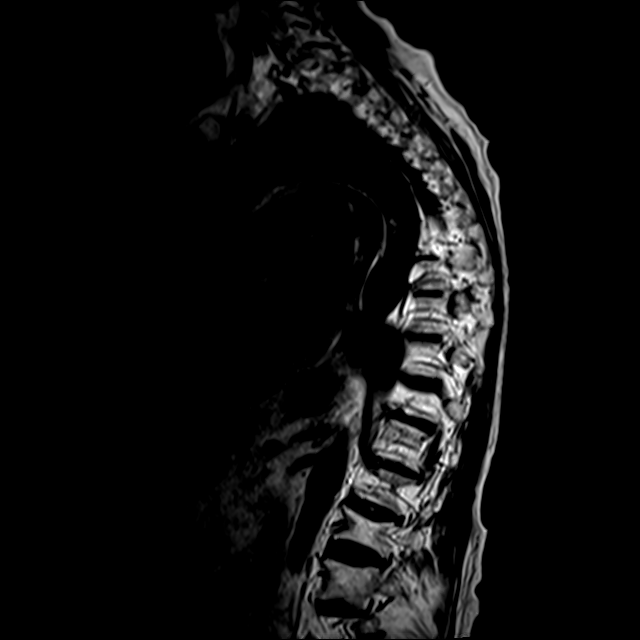

[Series 4: T2 · sagittal · 4.0mm · 0.59mm/px · 3 of 14 slices shown (2 of 3)]
[im 1/14]
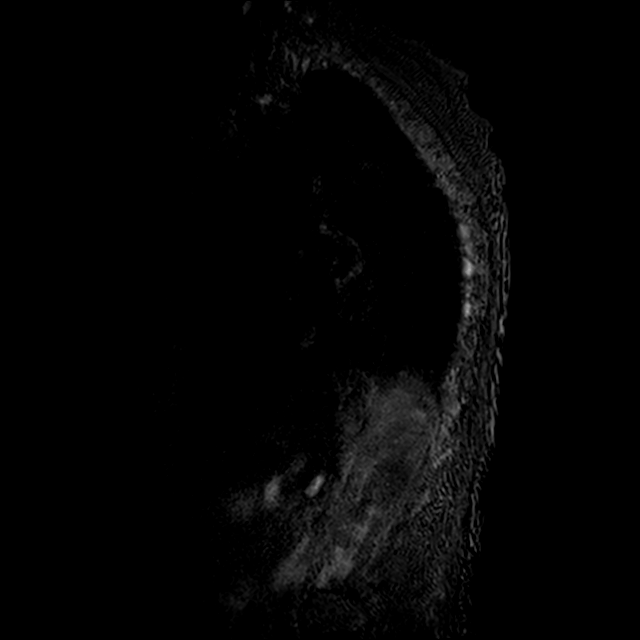
[im 7/14]
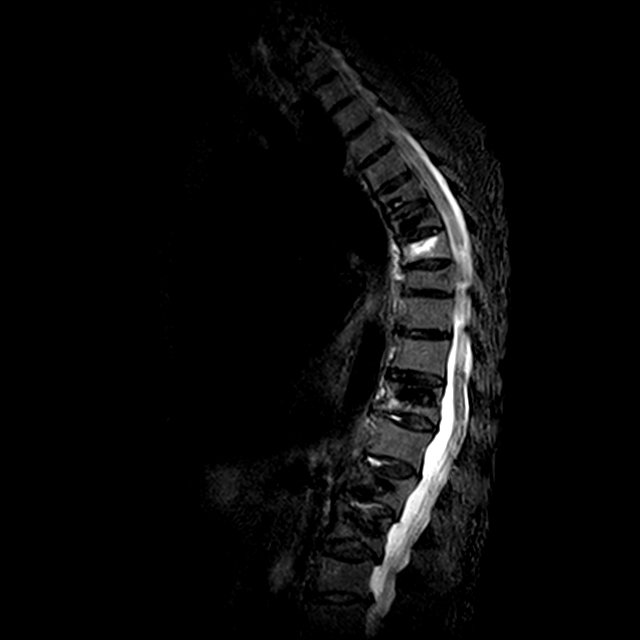
[im 14/14]
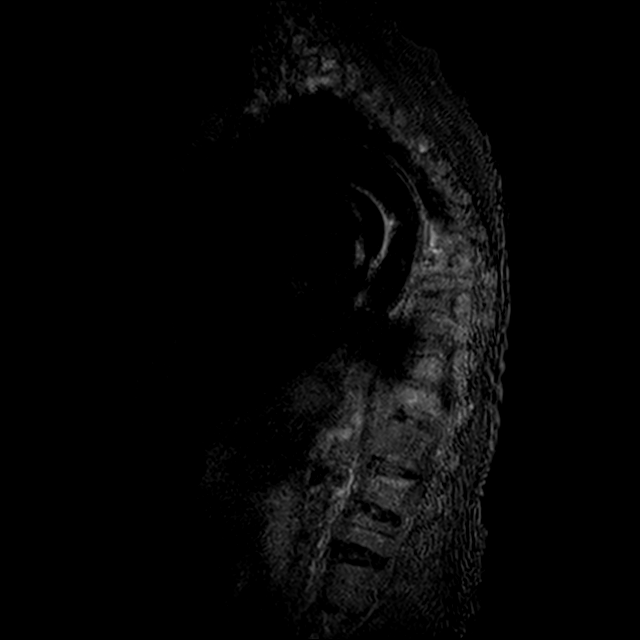

[Series 6: T2 · axial · 3.0mm · 0.26mm/px · z∈[-261,-170]mm · 3 of 36 slices shown (3 of 3)]
[im 6/36]
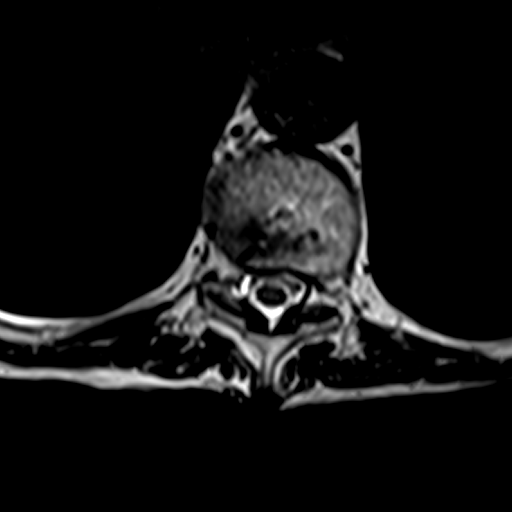
[im 18/36]
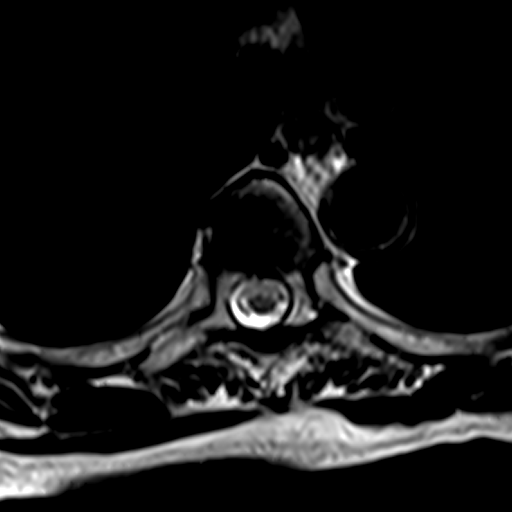
[im 30/36]
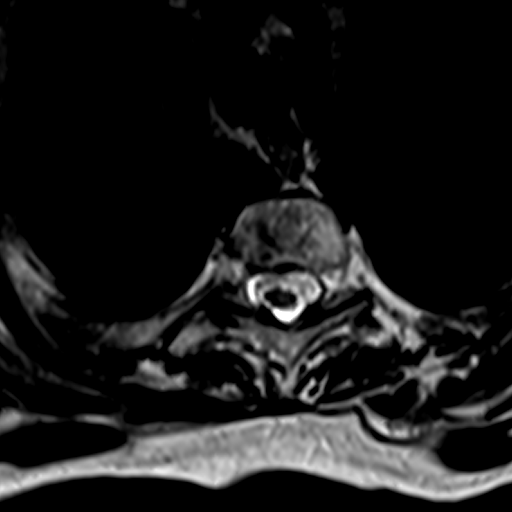

[14 of 48 positions shown; findings below may reference images not displayed]

FINDINGS: Alignment: Exaggerated thoracic kyphosis. Grade 1 anterolisthesis of
C7 on T1, unchanged.

Vertebrae: Interval augmentation of T6 vertebral compression
fracture. Chronic T7, T9, and T12 compression fractures status post
augmentation. New marrow edema superiorly in the T8 vertebral body
with mildly progressive vertebral body height loss, now 45%. No
suspicious osseous lesion.

Cord:  Normal signal and morphology.

Paraspinal and other soft tissues: Unremarkable.

Disc levels:

Diffuse lower cervical spine disc degeneration is only imaged
sagittally with disc bulging noted at C5-6 and C6-7 resulting in
suspected mild spinal stenosis. There is mild diffuse thoracic disc
degeneration including a tiny central disc protrusion at T2-3
without resultant spinal stenosis or spinal cord mass effect. Mild
thoracic facet arthrosis is noted without evidence of high-grade
neural foraminal stenosis.
IMPRESSION: Acute on chronic T8 compression fracture with 45% height loss.
